# Patient Record
Sex: Female | Born: 1955 | Marital: Married | Smoking: Never smoker
Health system: Southern US, Community
[De-identification: ages and names within clinical notes are randomized; demographics above are authoritative.]

## PROBLEM LIST (undated history)

## (undated) DIAGNOSIS — M3500 Sicca syndrome, unspecified: Secondary | ICD-10-CM

## (undated) DIAGNOSIS — E069 Thyroiditis, unspecified: Secondary | ICD-10-CM

## (undated) DIAGNOSIS — M8580 Other specified disorders of bone density and structure, unspecified site: Secondary | ICD-10-CM

## (undated) DIAGNOSIS — I2510 Atherosclerotic heart disease of native coronary artery without angina pectoris: Secondary | ICD-10-CM

## (undated) DIAGNOSIS — K644 Residual hemorrhoidal skin tags: Secondary | ICD-10-CM

## (undated) DIAGNOSIS — I491 Atrial premature depolarization: Secondary | ICD-10-CM

## (undated) DIAGNOSIS — E041 Nontoxic single thyroid nodule: Secondary | ICD-10-CM

## (undated) DIAGNOSIS — I493 Ventricular premature depolarization: Secondary | ICD-10-CM

## (undated) DIAGNOSIS — K5790 Diverticulosis of intestine, part unspecified, without perforation or abscess without bleeding: Secondary | ICD-10-CM

## (undated) DIAGNOSIS — I471 Supraventricular tachycardia, unspecified: Secondary | ICD-10-CM

## (undated) DIAGNOSIS — L659 Nonscarring hair loss, unspecified: Secondary | ICD-10-CM

## (undated) DIAGNOSIS — D179 Benign lipomatous neoplasm, unspecified: Secondary | ICD-10-CM

## (undated) DIAGNOSIS — I10 Essential (primary) hypertension: Secondary | ICD-10-CM

## (undated) DIAGNOSIS — R7989 Other specified abnormal findings of blood chemistry: Secondary | ICD-10-CM

## (undated) DIAGNOSIS — I7300 Raynaud's syndrome without gangrene: Secondary | ICD-10-CM

## (undated) HISTORY — DX: Atrial premature depolarization: I49.1

## (undated) HISTORY — DX: Thyroiditis, unspecified: E06.9

## (undated) HISTORY — DX: Supraventricular tachycardia: I47.1

## (undated) HISTORY — DX: Atherosclerotic heart disease of native coronary artery without angina pectoris: I25.10

## (undated) HISTORY — DX: Ventricular premature depolarization: I49.3

## (undated) HISTORY — DX: Supraventricular tachycardia, unspecified: I47.10

## (undated) HISTORY — DX: Other specified disorders of bone density and structure, unspecified site: M85.80

## (undated) HISTORY — DX: Residual hemorrhoidal skin tags: K64.4

## (undated) HISTORY — DX: Raynaud's syndrome without gangrene: I73.00

## (undated) HISTORY — DX: Nontoxic single thyroid nodule: E04.1

## (undated) HISTORY — DX: Essential (primary) hypertension: I10

## (undated) HISTORY — DX: Diverticulosis of intestine, part unspecified, without perforation or abscess without bleeding: K57.90

## (undated) HISTORY — DX: Nonscarring hair loss, unspecified: L65.9

## (undated) HISTORY — DX: Sjogren syndrome, unspecified: M35.00

## (undated) HISTORY — DX: Other specified abnormal findings of blood chemistry: R79.89

## (undated) HISTORY — DX: Benign lipomatous neoplasm, unspecified: D17.9

---

## 1997-11-20 ENCOUNTER — Ambulatory Visit (HOSPITAL_COMMUNITY): Admission: RE | Admit: 1997-11-20 | Discharge: 1997-11-20 | Payer: Self-pay | Admitting: Specialist

## 1998-03-08 ENCOUNTER — Other Ambulatory Visit: Admission: RE | Admit: 1998-03-08 | Discharge: 1998-03-08 | Payer: Self-pay | Admitting: Obstetrics and Gynecology

## 1999-03-14 ENCOUNTER — Other Ambulatory Visit: Admission: RE | Admit: 1999-03-14 | Discharge: 1999-03-14 | Payer: Self-pay | Admitting: Obstetrics and Gynecology

## 1999-06-05 ENCOUNTER — Encounter: Payer: Self-pay | Admitting: Internal Medicine

## 1999-06-05 ENCOUNTER — Encounter: Admission: RE | Admit: 1999-06-05 | Discharge: 1999-06-05 | Payer: Self-pay | Admitting: Internal Medicine

## 1999-12-05 ENCOUNTER — Encounter: Admission: RE | Admit: 1999-12-05 | Discharge: 1999-12-05 | Payer: Self-pay | Admitting: Obstetrics and Gynecology

## 1999-12-05 ENCOUNTER — Encounter: Payer: Self-pay | Admitting: Obstetrics and Gynecology

## 2000-01-20 ENCOUNTER — Encounter (INDEPENDENT_AMBULATORY_CARE_PROVIDER_SITE_OTHER): Payer: Self-pay

## 2000-01-20 ENCOUNTER — Other Ambulatory Visit: Admission: RE | Admit: 2000-01-20 | Discharge: 2000-01-20 | Payer: Self-pay | Admitting: Obstetrics and Gynecology

## 2000-04-07 ENCOUNTER — Other Ambulatory Visit: Admission: RE | Admit: 2000-04-07 | Discharge: 2000-04-07 | Payer: Self-pay | Admitting: Obstetrics and Gynecology

## 2000-04-07 ENCOUNTER — Encounter: Payer: Self-pay | Admitting: Obstetrics and Gynecology

## 2000-04-07 ENCOUNTER — Encounter: Admission: RE | Admit: 2000-04-07 | Discharge: 2000-04-07 | Payer: Self-pay | Admitting: Obstetrics and Gynecology

## 2001-04-13 ENCOUNTER — Encounter: Payer: Self-pay | Admitting: Obstetrics and Gynecology

## 2001-04-13 ENCOUNTER — Encounter: Admission: RE | Admit: 2001-04-13 | Discharge: 2001-04-13 | Payer: Self-pay | Admitting: Obstetrics and Gynecology

## 2001-04-15 ENCOUNTER — Encounter: Admission: RE | Admit: 2001-04-15 | Discharge: 2001-04-15 | Payer: Self-pay | Admitting: Obstetrics and Gynecology

## 2001-04-15 ENCOUNTER — Encounter: Payer: Self-pay | Admitting: Obstetrics and Gynecology

## 2001-05-20 ENCOUNTER — Encounter: Payer: Self-pay | Admitting: Geriatric Medicine

## 2001-05-20 ENCOUNTER — Ambulatory Visit (HOSPITAL_COMMUNITY): Admission: RE | Admit: 2001-05-20 | Discharge: 2001-05-20 | Payer: Self-pay | Admitting: Geriatric Medicine

## 2001-08-17 ENCOUNTER — Other Ambulatory Visit: Admission: RE | Admit: 2001-08-17 | Discharge: 2001-08-17 | Payer: Self-pay | Admitting: Obstetrics and Gynecology

## 2002-03-22 ENCOUNTER — Encounter: Payer: Self-pay | Admitting: Geriatric Medicine

## 2002-03-22 ENCOUNTER — Encounter: Admission: RE | Admit: 2002-03-22 | Discharge: 2002-03-22 | Payer: Self-pay | Admitting: Geriatric Medicine

## 2002-10-12 ENCOUNTER — Encounter: Payer: Self-pay | Admitting: Obstetrics and Gynecology

## 2002-10-12 ENCOUNTER — Encounter: Admission: RE | Admit: 2002-10-12 | Discharge: 2002-10-12 | Payer: Self-pay | Admitting: Obstetrics and Gynecology

## 2003-01-23 ENCOUNTER — Other Ambulatory Visit: Admission: RE | Admit: 2003-01-23 | Discharge: 2003-01-23 | Payer: Self-pay | Admitting: Obstetrics and Gynecology

## 2003-12-20 ENCOUNTER — Encounter: Admission: RE | Admit: 2003-12-20 | Discharge: 2003-12-20 | Payer: Self-pay | Admitting: Geriatric Medicine

## 2004-01-31 ENCOUNTER — Other Ambulatory Visit: Admission: RE | Admit: 2004-01-31 | Discharge: 2004-01-31 | Payer: Self-pay | Admitting: Obstetrics and Gynecology

## 2004-02-11 ENCOUNTER — Encounter: Admission: RE | Admit: 2004-02-11 | Discharge: 2004-02-11 | Payer: Self-pay | Admitting: Obstetrics and Gynecology

## 2004-09-10 ENCOUNTER — Encounter: Admission: RE | Admit: 2004-09-10 | Discharge: 2004-10-13 | Payer: Self-pay | Admitting: Geriatric Medicine

## 2005-02-09 ENCOUNTER — Other Ambulatory Visit: Admission: RE | Admit: 2005-02-09 | Discharge: 2005-02-09 | Payer: Self-pay | Admitting: Obstetrics and Gynecology

## 2005-03-09 ENCOUNTER — Encounter: Admission: RE | Admit: 2005-03-09 | Discharge: 2005-03-09 | Payer: Self-pay | Admitting: Obstetrics and Gynecology

## 2006-02-09 ENCOUNTER — Other Ambulatory Visit: Admission: RE | Admit: 2006-02-09 | Discharge: 2006-02-09 | Payer: Self-pay | Admitting: Obstetrics and Gynecology

## 2006-03-10 ENCOUNTER — Encounter: Admission: RE | Admit: 2006-03-10 | Discharge: 2006-03-10 | Payer: Self-pay | Admitting: Obstetrics and Gynecology

## 2006-04-02 ENCOUNTER — Encounter: Admission: RE | Admit: 2006-04-02 | Discharge: 2006-04-02 | Payer: Self-pay | Admitting: Occupational Medicine

## 2006-04-12 ENCOUNTER — Encounter: Admission: RE | Admit: 2006-04-12 | Discharge: 2006-04-12 | Payer: Self-pay | Admitting: Rheumatology

## 2006-09-03 ENCOUNTER — Encounter: Admission: RE | Admit: 2006-09-03 | Discharge: 2006-09-03 | Payer: Self-pay | Admitting: Geriatric Medicine

## 2007-02-22 ENCOUNTER — Other Ambulatory Visit: Admission: RE | Admit: 2007-02-22 | Discharge: 2007-02-22 | Payer: Self-pay | Admitting: Obstetrics and Gynecology

## 2007-03-15 ENCOUNTER — Encounter: Admission: RE | Admit: 2007-03-15 | Discharge: 2007-03-15 | Payer: Self-pay | Admitting: Obstetrics and Gynecology

## 2007-09-20 ENCOUNTER — Encounter: Admission: RE | Admit: 2007-09-20 | Discharge: 2007-09-20 | Payer: Self-pay | Admitting: Geriatric Medicine

## 2008-03-16 ENCOUNTER — Encounter: Admission: RE | Admit: 2008-03-16 | Discharge: 2008-03-16 | Payer: Self-pay | Admitting: Gynecology

## 2008-04-04 ENCOUNTER — Ambulatory Visit (HOSPITAL_COMMUNITY): Admission: RE | Admit: 2008-04-04 | Discharge: 2008-04-04 | Payer: Self-pay | Admitting: Rheumatology

## 2008-08-25 IMAGING — CR DG FOOT COMPLETE 3+V*R*
3 series · 3 of 3 positions shown · non-contrast
Comparison: none

CLINICAL DATA: Crush injury.  Pain fourth and fifth metatarsals.
 RIGHT FOOT - 3 VIEW:

[view not recorded (1 of 3)]
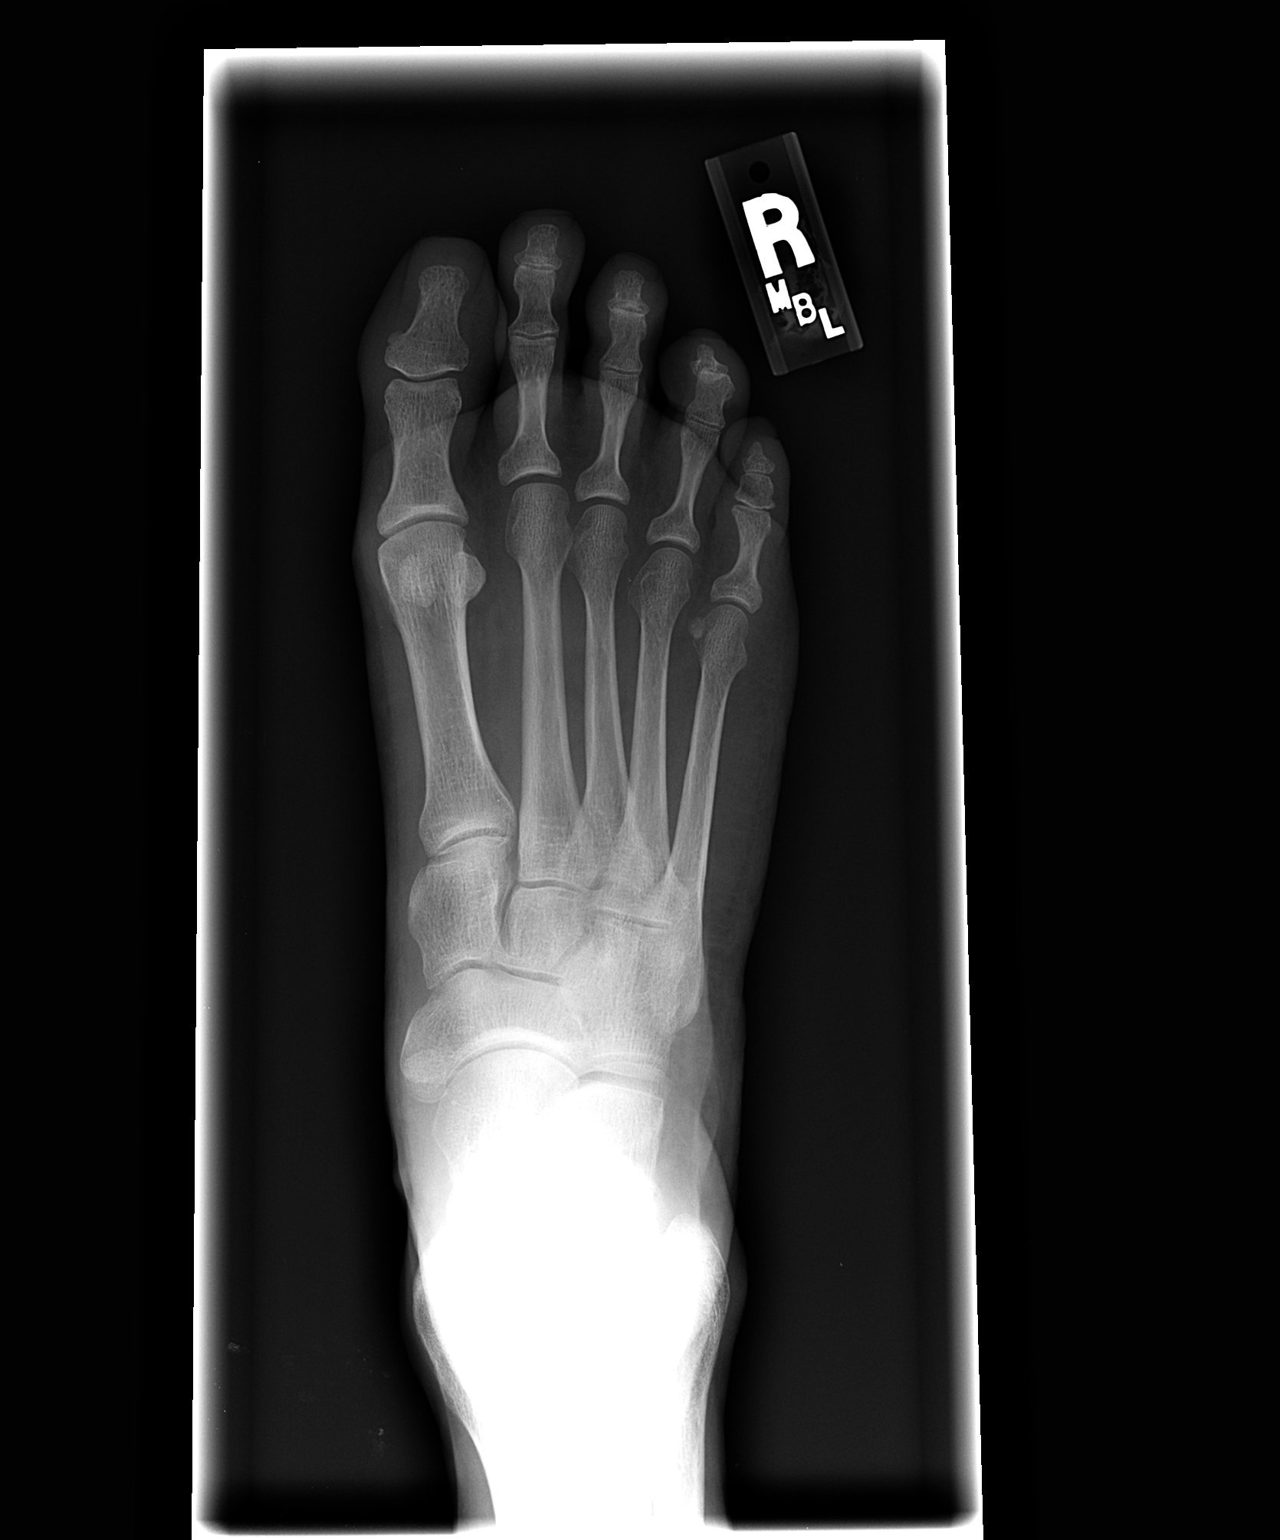

[view not recorded (2 of 3)]
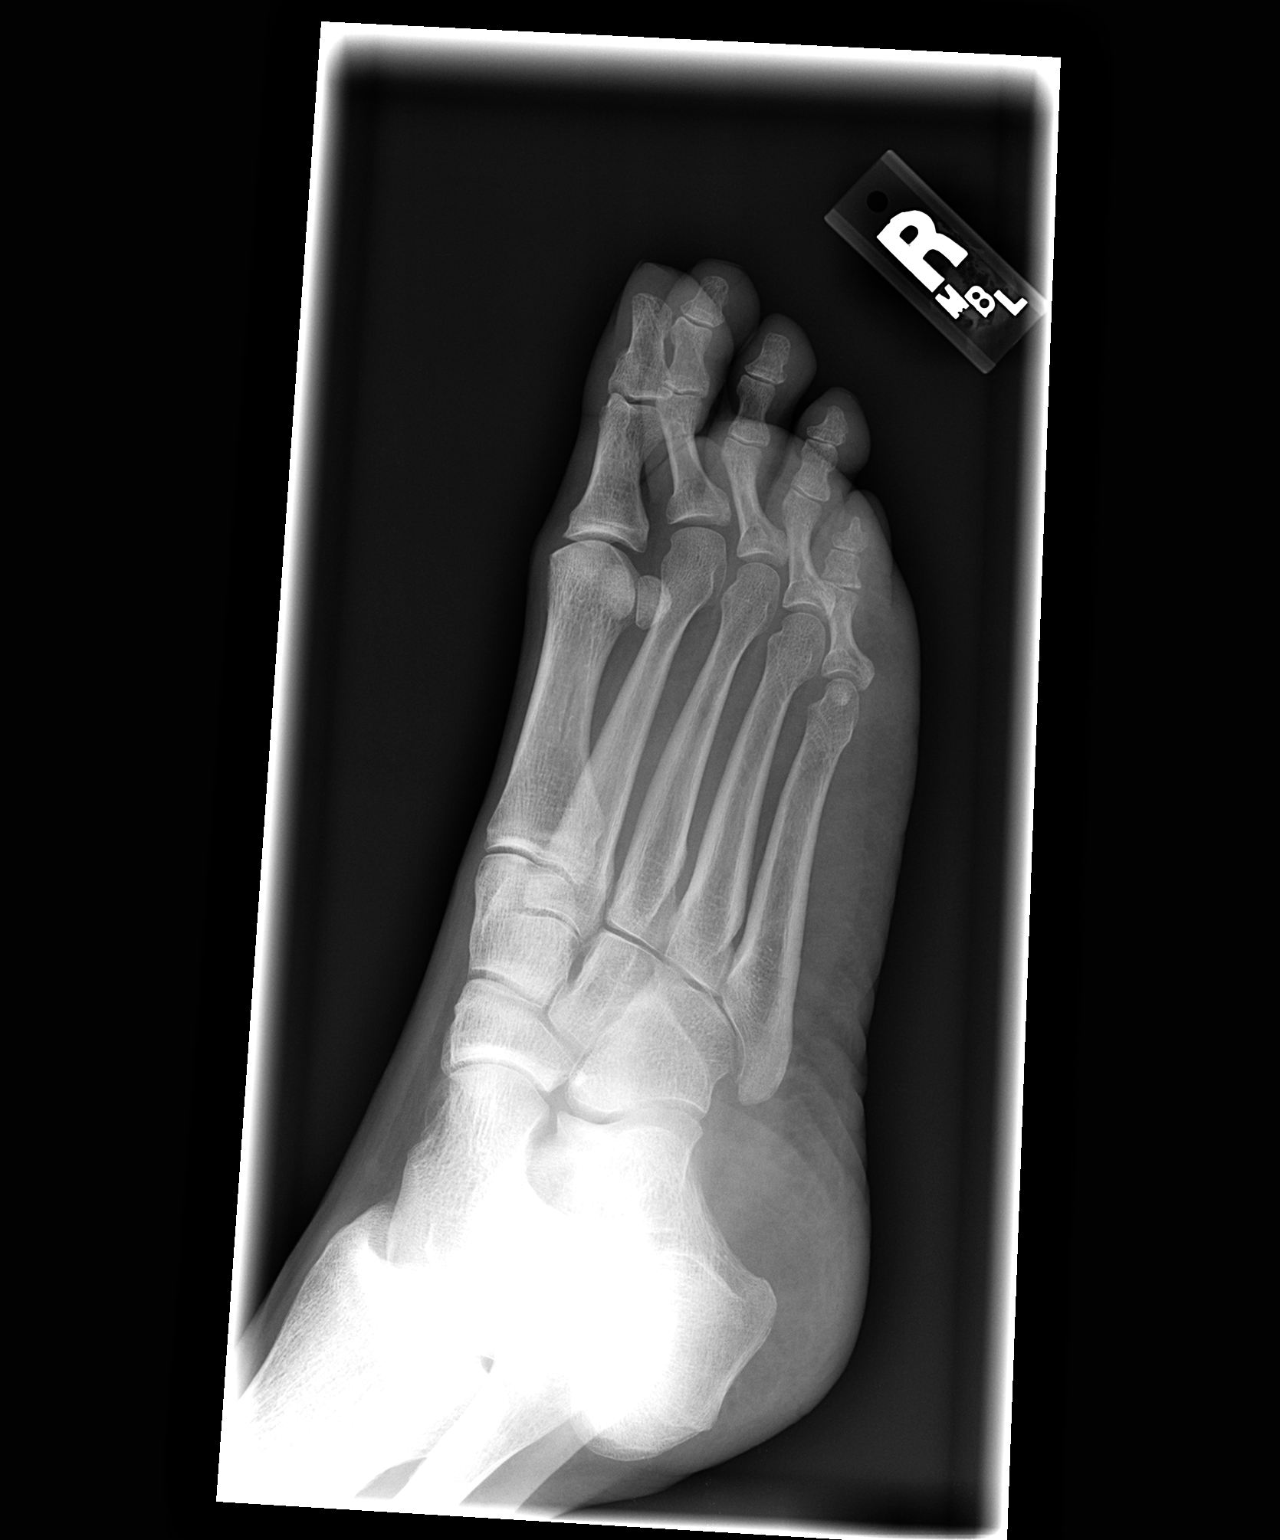

[view not recorded (3 of 3)]
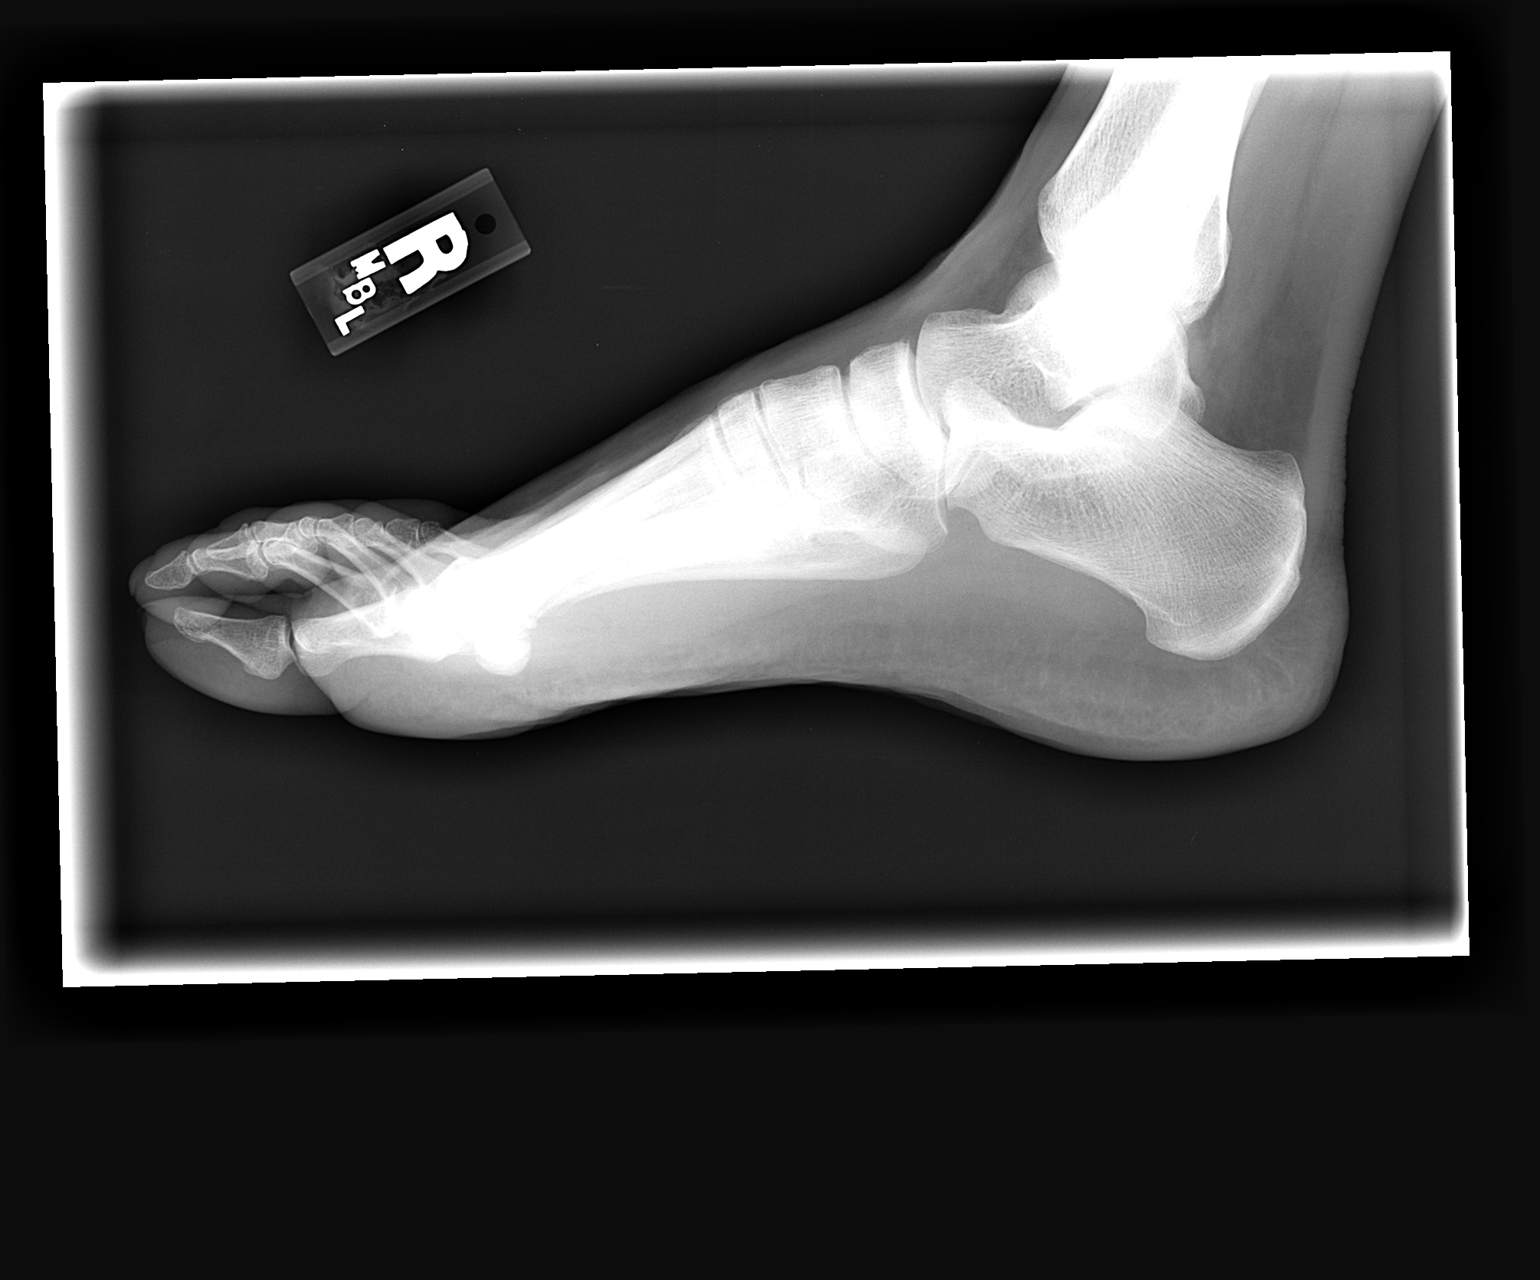

[3 of 3 positions shown; findings below may reference images not displayed]

FINDINGS: Imaged bones, joints and soft tissues appear normal.
IMPRESSION: Negative exam.

## 2009-04-18 ENCOUNTER — Encounter: Admission: RE | Admit: 2009-04-18 | Discharge: 2009-04-18 | Payer: Self-pay | Admitting: Gynecology

## 2009-06-18 ENCOUNTER — Encounter: Admission: RE | Admit: 2009-06-18 | Discharge: 2009-06-18 | Payer: Self-pay | Admitting: Rheumatology

## 2009-08-12 ENCOUNTER — Encounter: Admission: RE | Admit: 2009-08-12 | Discharge: 2009-08-12 | Payer: Self-pay | Admitting: Gynecology

## 2010-07-11 ENCOUNTER — Encounter
Admission: RE | Admit: 2010-07-11 | Discharge: 2010-07-11 | Payer: Self-pay | Source: Home / Self Care | Attending: Geriatric Medicine | Admitting: Geriatric Medicine

## 2010-08-10 ENCOUNTER — Encounter: Payer: Self-pay | Admitting: Orthopedic Surgery

## 2010-08-10 ENCOUNTER — Encounter: Payer: Self-pay | Admitting: Obstetrics and Gynecology

## 2010-08-11 ENCOUNTER — Encounter: Payer: Self-pay | Admitting: Gynecology

## 2010-12-04 ENCOUNTER — Other Ambulatory Visit: Payer: Self-pay | Admitting: Geriatric Medicine

## 2010-12-04 DIAGNOSIS — H93A9 Pulsatile tinnitus, unspecified ear: Secondary | ICD-10-CM

## 2010-12-10 ENCOUNTER — Ambulatory Visit
Admission: RE | Admit: 2010-12-10 | Discharge: 2010-12-10 | Disposition: A | Discharge status: Home / Self Care | Payer: BC Managed Care – PPO | Source: Ambulatory Visit | Attending: Geriatric Medicine | Admitting: Geriatric Medicine

## 2010-12-10 DIAGNOSIS — H93A9 Pulsatile tinnitus, unspecified ear: Secondary | ICD-10-CM

## 2010-12-10 MED ORDER — GADOBENATE DIMEGLUMINE 529 MG/ML IV SOLN
13.0000 mL | Freq: Once | INTRAVENOUS | Status: AC | PRN
  Administered 2010-12-10: 13 mL via INTRAVENOUS

## 2011-06-22 ENCOUNTER — Other Ambulatory Visit: Payer: Self-pay | Admitting: Internal Medicine

## 2011-06-22 DIAGNOSIS — E042 Nontoxic multinodular goiter: Secondary | ICD-10-CM

## 2011-06-22 DIAGNOSIS — E0590 Thyrotoxicosis, unspecified without thyrotoxic crisis or storm: Secondary | ICD-10-CM

## 2011-07-02 ENCOUNTER — Encounter (HOSPITAL_COMMUNITY)
Admission: RE | Admit: 2011-07-02 | Discharge: 2011-07-02 | Disposition: A | Discharge status: Home / Self Care | Payer: BC Managed Care – PPO | Source: Ambulatory Visit | Attending: Internal Medicine | Admitting: Internal Medicine

## 2011-07-02 DIAGNOSIS — E0590 Thyrotoxicosis, unspecified without thyrotoxic crisis or storm: Secondary | ICD-10-CM

## 2011-07-02 DIAGNOSIS — E042 Nontoxic multinodular goiter: Secondary | ICD-10-CM

## 2011-07-03 ENCOUNTER — Ambulatory Visit (HOSPITAL_COMMUNITY)
Admission: RE | Admit: 2011-07-03 | Discharge: 2011-07-03 | Disposition: A | Discharge status: Home / Self Care | Payer: BC Managed Care – PPO | Source: Ambulatory Visit | Attending: Internal Medicine | Admitting: Internal Medicine

## 2011-07-03 DIAGNOSIS — E042 Nontoxic multinodular goiter: Secondary | ICD-10-CM | POA: Insufficient documentation

## 2011-07-03 DIAGNOSIS — E0590 Thyrotoxicosis, unspecified without thyrotoxic crisis or storm: Secondary | ICD-10-CM | POA: Insufficient documentation

## 2011-07-03 MED ORDER — SODIUM PERTECHNETATE TC 99M INJECTION
10.0000 | Freq: Once | INTRAVENOUS | Status: AC | PRN
  Administered 2011-07-03: 10 via INTRAVENOUS

## 2014-11-14 ENCOUNTER — Ambulatory Visit (INDEPENDENT_AMBULATORY_CARE_PROVIDER_SITE_OTHER): Payer: No Typology Code available for payment source | Admitting: Sports Medicine

## 2014-11-14 ENCOUNTER — Encounter: Payer: Self-pay | Admitting: Sports Medicine

## 2014-11-14 VITALS — BP 111/56 | Ht 64.0 in | Wt 146.0 lb

## 2014-11-14 DIAGNOSIS — M7712 Lateral epicondylitis, left elbow: Secondary | ICD-10-CM | POA: Diagnosis not present

## 2014-11-14 DIAGNOSIS — M79675 Pain in left toe(s): Secondary | ICD-10-CM

## 2014-11-14 MED ORDER — NITROGLYCERIN 0.2 MG/HR TD PT24: MEDICATED_PATCH | TRANSDERMAL | Status: DC

## 2014-11-14 NOTE — Assessment & Plan Note (Signed)
Morton's foot. Medial column insufficiency. -Toe spacers given today -Would like to assess her gait and feet more with possibly trying green sport insoles at a future visit before her trip to [place] in September.

## 2014-11-14 NOTE — Progress Notes (Signed)
   Subjective:    Patient ID: Annette Flores, female    DOB: 1955-10-09, 59 y.o.   MRN: [id number]  HPI Annette Flores is a 59 year old right-hand-dominant female who presents with left elbow pain and left great toe pain. 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 Initially she had right lateral epicondylar pain, but this resolved after taking vitamin B 6 twice daily. Last November she started noticing that the left lateral epicondyles started hurting as well. Her pain has increased and occasionally wakes her up at night. Symptoms are aggravated with lifting objects, opening jars, or extending her wrist. Location of pain is primarily over the lateral epicondyles and an area just distal to. She denies any significant numbness, tingling, or weakness. No localized swelling or induration. She has tried taking vitamin B 6 once daily for the past week. She has also tried ice and massage. She saw a chiropractor who prescribed an exercise program, but she says that this initially aggravated her symptoms, feeling like something was "tearing". She denies any elbow locking, catching, swelling, or instability.  She also notes some left great toe pain. Location of pain is between the first and second metatarsals. No known injury. No localized swelling, warmth, or induration. She does not wear high heels frequently. She denies any numbness or tingling. She has not tried any relieving factors.  Past medical history, social history, medications, and allergies were reviewed and are up to date in the chart.  Review of Systems 7 point review of systems was performed and was otherwise negative unless noted in the history of present illness.     Objective:   Physical Exam BP 111/56 mmHg  Ht 5\' 4"  (1.626 m)  Wt 146 lb (66.225 kg)  BMI 25.05 kg/m2 GEN: The patient is well-developed well-nourished female and in no acute distress.  She is  awake alert and oriented x3. SKIN: warm and well-perfused, no rash  Neuro: Strength 5/5 globally. Sensation intact throughout. DTRs 2/4 bilaterally. No focal deficits. Vasc: +2 bilateral distal pulses. No edema.  MSK: Examination of the left elbow reveals grossly full range of motion with flexion and extension as well as pronation and supination. No localized swelling, warmth, or induration. No laxity with valgus varus testing. Tenderness is elicited with palpation over the left lateral epicondyles. Pain is reproduced with resisted supination and resisted wrist extension. No atrophy. Negative Tinel's at the medial epicondyle. The triceps tendon is palpably intact. She is neurovascularly intact distally. Examination of the bilateral feet reveals breakdown of the medial column and a Morton's foot. The second toe migrates toward the first toe to assist with pushoff. No overlying erythema or induration.  Limited musculoskeletal ultrasound: Long and short axis views were obtained of the left elbow and left great toe. There is an area of hypoechoic change throughout the left lateral epicondyles with a calcification located near the insertion. There is significant neo vessel formation in an area 2-3 cm distal to the proximal insertion. She also has some hyperechoic regions suggestive of scar tissue. No acute bony changes are seen in the cortex. No effusion is present. Examination of the left first MTP reveals a calcific spur on the dorsum with surrounding hypoechoic fluid. No significant joint effusion.    Assessment & Plan:  Please see problem based assessment and plan in the problem list.

## 2014-11-14 NOTE — Assessment & Plan Note (Signed)
[place] with hypoechoic split, neovessels, calcification, and scar tissue. -Start NTG protcol -Body helix compression sleeve fitted and applied. -HEP -f/u 6 wks or sooner prn

## 2014-11-14 NOTE — Patient Instructions (Signed)
-  Recommend ergonomic assessment at workplace -Start nitroglycerin patch (instructions below), plan for 6-12 weeks of the patch -Wear body helix compression sleeve for comfort -Continue Vit B6 1-2 times daily -Gel spacer to try between 1st and 2nd toes -Start home exercises for elbow within comfort level -Plan follow-up before your trip to assess if we can modify some sport insoles for your shoes to unload the pressure on your great toe. -Otherwise, follow-up in 6 weeks or sooner if needed

## 2014-12-26 ENCOUNTER — Ambulatory Visit: Payer: No Typology Code available for payment source | Admitting: Sports Medicine

## 2015-01-23 ENCOUNTER — Encounter: Payer: Self-pay | Admitting: Sports Medicine

## 2015-01-23 ENCOUNTER — Ambulatory Visit (INDEPENDENT_AMBULATORY_CARE_PROVIDER_SITE_OTHER): Payer: No Typology Code available for payment source | Admitting: Sports Medicine

## 2015-01-23 VITALS — BP 132/57 | HR 62 | Ht 64.0 in | Wt 146.0 lb

## 2015-01-23 DIAGNOSIS — M7712 Lateral epicondylitis, left elbow: Secondary | ICD-10-CM | POA: Diagnosis not present

## 2015-01-23 MED ORDER — NITROGLYCERIN 0.2 MG/HR TD PT24: MEDICATED_PATCH | TRANSDERMAL | Status: DC

## 2015-01-23 NOTE — Progress Notes (Signed)
Patient ID: Annette Flores, female   DOB: [birth year], 59 y.o.   MRN: [id number]  HPI: Annette Flores is a 59 y.o. female presenting for follow-up on her left lateral epicondylitis and left toe pain. Patient states the her left elbow pain is much improved with about a 50% improvement. She no longer has the nerve pain or tearing feeling that she previously had. She states she is able to do her routine activities without pain. She did not use the sleeve that was prescribed to her due to irritation and discomfort. She states she had improvement with the stretching exercises and NTG patches. NTG reduced pain almost immediately. Patient was unable to do strengthen exercises because it "hurt too bad".  But has continued other exercises with trainer.  Continues to endorse intermittent bone pain from bone spur.  Physical Exam: BP 132/57 mmHg  Pulse 62  Ht 5\' 4"  (1.626 m)  Wt 146 lb (66.225 kg)  BMI 25.05 kg/m2  Gen: well-developed, well-nourished, NAD, alert MSK: Full ROM with left elbow, No laxity with valgus/varus testing, No localized swelling or erythema. Non-tender to palpation of lateral epicondyle. Pain reproducible with book raise. Not noted with wrist or finger extension/ not noted with supination resistance Neuro: Sensation and strength intact Skin: warm, dry, and no notable rashes  Limited MSK [place]: Improvement in tendon appearance with reformation of tendon from initial tear that was seen. Bone spur along lateral epicondyle. No neo-vessel formation in tendon but some around bone spur. Much less hypoechoic change in common tendon  Assessment/Plan: Patient Active Problem List   Diagnosis Date Noted  . Left lateral epicondylitis 11/14/2014  . Pain of left great toe 11/14/2014   A: Improved epicondylitis. Stable left great toe bone spur.  - Encouraged strengthening exercises for patient such as dumbbell, wrist extension and rotation exercises/ isometric arm extension -  Continue NTG patches - Advised use of sleeve when doing arm exercises or heavy lifting - No treatment at this time for bone spur; conservative management - Follow-up as needed   Luiz Blare, DO 01/23/2015, 11:02 AM PGY-2, [place]

## 2015-01-23 NOTE — Patient Instructions (Addendum)
-  Recommend isometric exercises of arm to strengthen  -use small dumbbells and lift, work on wrist exercises such as lift and rotation with weights -Continue nitroglycerin patch, reordered for you -Wear body helix compression sleeve when using left arm for exercise -Conservative management for bone spur in foot; if continues to be a problem can consider other options -Follow-up as needed

## 2015-01-23 NOTE — Assessment & Plan Note (Signed)
Much improved However to get more complete tendon healing should cont NTG for another 2 mos  If not better in 2 mos I want to rechekc

## 2015-12-26 ENCOUNTER — Encounter: Payer: Self-pay | Admitting: Sports Medicine

## 2015-12-26 ENCOUNTER — Ambulatory Visit (INDEPENDENT_AMBULATORY_CARE_PROVIDER_SITE_OTHER): Payer: Managed Care, Other (non HMO) | Admitting: Sports Medicine

## 2015-12-26 VITALS — BP 132/81 | HR 67 | Ht 64.0 in | Wt 147.0 lb

## 2015-12-26 DIAGNOSIS — M216X9 Other acquired deformities of unspecified foot: Secondary | ICD-10-CM

## 2015-12-26 DIAGNOSIS — Q667 Congenital pes cavus, unspecified foot: Secondary | ICD-10-CM | POA: Insufficient documentation

## 2015-12-26 DIAGNOSIS — M2042 Other hammer toe(s) (acquired), left foot: Secondary | ICD-10-CM

## 2015-12-26 DIAGNOSIS — M2040 Other hammer toe(s) (acquired), unspecified foot: Secondary | ICD-10-CM | POA: Insufficient documentation

## 2015-12-26 NOTE — Assessment & Plan Note (Signed)
Cause of her symptoms being collapse the transverse arch with a flexion deformity at the PIP joint left second digit. She does have a Morton's foot with a high arch. -Green inserts with metatarsal pad to help support her metatarsal heads. Follow-up in 4-6 weeks which point consider custom orthotics. Also recommend anti-inflammatories as needed as well as wide toebox shoes.

## 2015-12-26 NOTE — Progress Notes (Signed)
  ALTHERIA VONG - 60 y.o. female MRN [id number]  Date of birth: [birth year] GEORGENA UNSELL is a 60 y.o. female who presents today for L foot pain.  Left foot pain, initial visit 56/8/17-patient presents today with ongoing dorsal foot pain forefoot. This been ongoing now for several months and she has previously seen Dr. Georgena Spurling at [place]. He recommended eventual surgery to 4 a tendon release for her hammertoe . She is not interested at this point in this procedure. Pain is worse at the end of the day or if she wears tight shoes.   PMHx - Updated and reviewed.  Contributory factors include:  Negative PSHx - Updated and reviewed.  Contributory factors include:  Negative FHx - Updated and reviewed.  Contributory factors include:  Negative Social Hx - Updated and reviewed. Contributory factors include:  Nonsmoker  Medications - reviewed   ROS Per HPI.  12 point negative other than per HPI.   Exam:  Filed Vitals:   12/26/15 1015  BP: 132/81  Pulse: 67   Gen: NAD, AAO 3 Cardio- RRR Pulm - Normal respiratory effort/rate Skin: No rashes or erythema Extremities: No edema  Vascular: pulses +2 bilateral upper and lower extremity Psych: Normal affect  Feet: Pes cavus with collapse the transverse arch left greater than right. She does have hammertoe left greater than right as well. Neurovascularly intact

## 2016-04-10 ENCOUNTER — Other Ambulatory Visit: Payer: Self-pay | Admitting: Obstetrics and Gynecology

## 2016-04-10 DIAGNOSIS — M7712 Lateral epicondylitis, left elbow: Secondary | ICD-10-CM

## 2016-08-17 ENCOUNTER — Other Ambulatory Visit: Payer: Self-pay | Admitting: Gastroenterology

## 2016-08-17 DIAGNOSIS — R1310 Dysphagia, unspecified: Secondary | ICD-10-CM

## 2016-08-17 DIAGNOSIS — R1319 Other dysphagia: Secondary | ICD-10-CM

## 2016-08-21 ENCOUNTER — Ambulatory Visit
Admission: RE | Admit: 2016-08-21 | Discharge: 2016-08-21 | Disposition: A | Discharge status: Home / Self Care | Payer: Managed Care, Other (non HMO) | Source: Ambulatory Visit | Attending: Gastroenterology | Admitting: Gastroenterology

## 2016-08-21 DIAGNOSIS — R1310 Dysphagia, unspecified: Secondary | ICD-10-CM

## 2016-08-21 DIAGNOSIS — R1319 Other dysphagia: Secondary | ICD-10-CM

## 2017-08-17 ENCOUNTER — Ambulatory Visit: Payer: PRIVATE HEALTH INSURANCE | Admitting: Sports Medicine

## 2017-08-17 ENCOUNTER — Ambulatory Visit: Payer: Self-pay

## 2017-08-17 ENCOUNTER — Encounter: Payer: Self-pay | Admitting: Sports Medicine

## 2017-08-17 VITALS — BP 120/60 | Ht 64.0 in | Wt 147.0 lb

## 2017-08-17 DIAGNOSIS — M25511 Pain in right shoulder: Secondary | ICD-10-CM

## 2017-08-17 DIAGNOSIS — S76312A Strain of muscle, fascia and tendon of the posterior muscle group at thigh level, left thigh, initial encounter: Secondary | ICD-10-CM | POA: Diagnosis not present

## 2017-08-17 MED ORDER — NITROGLYCERIN 0.2 MG/HR TD PT24: MEDICATED_PATCH | TRANSDERMAL | 1 refills | Status: DC

## 2017-08-17 NOTE — Patient Instructions (Signed)

## 2017-08-17 NOTE — Progress Notes (Signed)
Chief complaint: Right arm pain 3 months, left hamstring pain 4 months  History of present illness: Annette Flores is a 62 year old right-hand dominant female who presents to the sports medicine office today with chief complaint of right arm pain as well as left hamstring pain.  In regards to the right arm pain, she reports that symptoms have been present for approximately 3 months.  She reports that back 3 months ago she was doing some plank exercises, reports that she started feeling some pain after doing plank exercises.  She points to the front of her right shoulder near the short head of the biceps tendon as to where she feels the pain.  She reports pain with any type of abduction and overhead shoulder motion.  She reports feeling weak in the right arm secondary to pain.  She does not report of any numbness, tingling, or burning paresthesias radiating down the right arm.  She does not report of any previous right shoulder injury.  She does not report of any popping, catching, or symptoms of instability. She does not report of any neck pain or right elbow pain.  She has not had to use any medications for pain.  Over the last month or so she feels that symptoms have slightly improved.  In regards to her left hamstring pain, she reports that symptoms have been present for approximately 4 months.  She does not report of any specific inciting incident, trauma, or injury to explain the pain.  She points to the left proximal hamstring near the ischial tuberosity as point of maximal tenderness.  She reports any type of left leg extension as aggravating factor.  She reports that she does pain when she runs. She has not been able to run since December secondary to pain.  She starts to notice the pain at about the one mile mark.  She reports walking does not cause pain.  She reports noticing pain at nighttime if she stretches out her legs. She describes the pain as a throbbing and aching pain.   She does not report of  any numbness, tingling, or burning paresthesias radiating down her left lower extremity. She does not report of any back pain or left hip pain.  Review of systems:  As stated above  Her past medical history, surgical history, family history, and social history obtained and reviewed. Past medical history notable for hypothyroidism, she has not had any operations/procedures, she does not report of any current tobacco use, family history unremarkable for type 2 diabetes  Physical exam: Vital signs are reviewed and are documented in the chart Gen.: Alert, oriented, appears stated age, in no apparent distress HEENT: Moist oral mucosa Respiratory: Normal respirations, able to speak in full sentences Cardiac: Regular rate, distal pulses 2+ Integumentary: No rashes on visible skin:  Neurologic: She does have normal, intact rotator cuff strength on the right side, would categorize this as 5/5, she does have slight weakness with hamstring strength on the left side, would categorize strength as 4+/5, otherwise strength in bilateral upper and lower extremities 5/5, sensation 2+ in bilateral upper and lower extremities Psych: Normal affect, mood is described as good Musculoskeletal: Inspection of right shoulder reveals no obvious deformity or muscle atrophy, no warmth, erythema, ecchymosis, or effusion, she is tender to palpation in the right anterior shoulder over the short head of the biceps tendon, no tenderness over the distal clavicle, AC joint, acromion, scapular spine, cervical spine, or paracervical spinal processes, no tenderness over the trapezius, rhomboids, deltoid,  or triceps, she does have full range of motion of her right shoulder, but she does experience pain when getting to about 120 of forward flexion and 90 of abduction on the right side, she does have normal internal and external range of motion, strength as noted above, she does have pain with Speed, Yergason equivocal, she does have some  pain with empty can, cross arm, and O'Brien; Hawkins and Spurling negative  Musculoskeletal ultrasound was performed on her right shoulder today: -She does have circumferential hypoechoic changes seen along the proximal biceps tendon, but does have intact biceps tendon fibers, no intrasubstance hypoechogenicity seen -Unremarkable subscapularis tendon -She does have some hypoechoic changes and cortical irregularity noted along the articular side of the supraspinatus indicative of partial tearing of the supraspinatus tendon, do also see slight hypoechoic changes along the bursal side of the supraspinatus -Mushroom sign noted at [facility] joint and mild AC joint osteoarthritis noted with slight joint space narrowing -Unremarkable infraspinatus tendon -Unremarkable teres minor tendon  Impression: 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 Right shoulder pain, with ultrasound evidence of supraspinatus tendonopathy and partial degenerative tearing  2. Right proximal biceps tendonopathy without evidence of tearing 3. Left hamstring insertional tendonopathy 4. History of left lateral epicondylitis, asymptomatic today 5. History of pes cavus bilaterally  Plan: Discussed with [name] today that in regards to her right shoulder pain she does have ultrasound evidence of partial degenerative supraspinatus tearing as well as tendinopathy of the right proximal biceps tendon.  Discussed having her being started on nitroglycerin patch for partial degenerative supraspinatus tearing.  This would certainly explain her pain with abduction.  Will have her started on gentle range of motion and stretching exercises  For her right shoulder, discussed not going above 90 of forward flexion and abduction with these exercises.  For her left hamstring pain, discussed having insertional hamstring tendinopathy without any evidence of tearing.  Discussed to have her started on Askling exercises to do at home. This was given to her today.  Will plan to have her returnin 4-6 weeks for follow-up with repeat ultrasound at that time. Otherwise, will have her return sooner as needed.    Mort Sawyers, M.D. [facility]

## 2017-09-28 ENCOUNTER — Ambulatory Visit: Payer: PRIVATE HEALTH INSURANCE | Admitting: Sports Medicine

## 2017-10-05 ENCOUNTER — Ambulatory Visit: Payer: Self-pay

## 2017-10-05 ENCOUNTER — Encounter: Payer: Self-pay | Admitting: Sports Medicine

## 2017-10-05 ENCOUNTER — Ambulatory Visit: Payer: PRIVATE HEALTH INSURANCE | Admitting: Sports Medicine

## 2017-10-05 VITALS — BP 108/72 | Ht 64.0 in | Wt 146.0 lb

## 2017-10-05 DIAGNOSIS — M25511 Pain in right shoulder: Secondary | ICD-10-CM

## 2017-10-05 NOTE — Progress Notes (Signed)
Chief complaint: Follow-up of right arm pain x 4 months, left hamstring pain x 5 months  History of present illness: Annette Flores is a 62 year old right-hand-dominant female who presents to sports medicine office today for follow-up of right arm pain and left hamstring pain.  She was here in the office about 6 weeks ago for evaluation of these issues. Ultrasound of her right shoulder was done which showed partial degenerative articular sided supraspinatus tear and biceps tendinopathy without any evidence of degenerative tearing.  Ultrasound on her left hamstrings that she had insertional tendinopathy with slight calcification noted.  Fortunately in the interval, she reports complete resolution of left hamstring pain.  She reports that she does not any pain with leg extension like she did have least time.  She reports that she really has not been running though so she has not put this to the test.  She reports that she has been walking without any issues.  No numbness, tingling, or burning paresthesias in either lower extremity.  In regards to her right shoulder, she reports that with exercises she has noted interval improvement.  She reports that she was unable to use the nitroglycerin patch as she started getting headaches.  She reports that up until 2 weeks ago she was intervally improving.  She reports that she did do some painting and she started to get re-aggravation of symptoms with right anterior shoulder pain.  She reports that she has been doing the exercises as well as going to the gym and working with a trainer.  She reports that manipulation in her right shoulder and doing some posturing skills has helped.  She reports that yesterday she was pulling some weeds and noted some pain again.  She feels overall though symptoms are improving.  She has not had to use any medications for pain otherwise.  No numbness, tingling, burning paresthesias radiating down her right arm.  She does not report of any neck  pain.  She does report still feeling weak in the right arm, has not been able to use to 5 pound dumbbells for her home exercises.  She still reports noticing pain the most when doing any type of abduction or overhead shoulder motion.  Review of systems:  As stated above  Interval past medical history, surgical history, family history, and social history obtained and unchanged. Past medical history notable for hypothyroidism, she has not had any operations/procedures, she does not report of any current tobacco use, family history unremarkable for type 2 diabetes  Physical exam: Vital signs are reviewed and are documented in the chart Gen.: Alert, oriented, appears stated age, in no apparent distress HEENT: Moist oral mucosa Respiratory: Normal respirations, able to speak in full sentences Cardiac: Regular rate, distal pulses 2+ Integumentary: No rashes on visible skin:  Neurologic: Has great rotator cuff strength on right side, would categorize rotator cuff strength as 5/5, otherwise/5 in bilateral upper extremities, she does have great hamstring strength on the left side, would categorize this strength as 5/5, sensation 2+ in bilateral upper and lower extremities Psych: Normal affect, mood is described as good Musculoskeletal: Inspection of right shoulder reveals no obvious deformity or muscle atrophy, no warmth, erythema, ecchymosis, or effusion, she is tender to palpation over the short head of the biceps, otherwise no tenderness to palpation anywhere in the shoulder including the distal clavicle, AC joint, acromion, scapular spine, cervical spine, paracervical spinal processes, no tenderness of the trapezius, rhomboids, deltoid, triceps, she continues to maintain full range of motion  of her shoulder today, fortunately today without any elicitation of pain, rotator cuff impingement testing is negative as empty can, cross arm, O'Brien, Neer's, Hawkins are all negative, Speed, Yergason, and Spurling  negative; Inspection of left hamstrings reveals no obvious deformity or muscle atrophy, no warmth, erythema, ecchymosis, or effusion, no tenderness anywhere in the left hamstrings today, including the insertional aspect of the left hamstring on ischial tuberosity  Complete musculoskeletal ultrasound was performed on her right shoulder today: -She does have very slight physiologic circumferential hypoechoic changes seen along the mid-proximal biceps tendon, interval improvement from last time, has intact biceps tendon fibers, no intrasubstance hypoechogenicity seen -Unremarkable appearing subscapularis tendon -Interval improvement with healing noted along the bursal side of the supraspinatus, has a very small (~3 mm) calcification along the bursal side of supraspinatus, interval decrease in hypoechogenicity along the articular side of the supraspinatus -Mushroom sign noted at [facility] joint and mild AC joint osteoarthritis noted with slight joint space narrowing -Unremarkable appearing infraspinatus tendon -Unremarkable appearing teres minor tendon  Impression: Interval healing of degenerative articular-sided supraspinatus tendinopathy, as well as more proximal biceps tendonopathy without evidence of degenerative tearing  Ultrasound performed and interpreted by Mort Sawyers, MD and Stefanie Libel MD  Assessment and plan: 1. Right shoulder pain, with previous ultrasound evidence of supraspinatus tendonopathy and partial degenerative tearing, with interval improvement today 2. Right proximal biceps tendonopathy without evidence of tearing 3. Left hamstring insertional tendinopathy, symptoms resolved today 4. History of left lateral epicondylitis, asymptomatic today 5. History of pes cavus bilaterally  Plan: Discussed with Graylyn that does have interval improvement in healing for her right shoulder pain and supraspinatus tendinopathy.  Discussed continued range of motion and physical therapy  exercises for stretching and strengthening.  Discussed she does not need to use any medications for pain unless she has a flareup.  Discussed continued gym exercises and manipulation with her trainer.  If she is not any better in 4-6 weeks, discussed to have her return to office.  Otherwise, we will have her return on as-needed basis.  Mort Sawyers, M.D. [facility]  I observed and examined the patient with the resident and agree with assessment and plan.  Note reviewed and modified by me. Stefanie Libel, MD

## 2019-06-27 ENCOUNTER — Other Ambulatory Visit: Payer: Self-pay

## 2019-06-27 ENCOUNTER — Telehealth (INDEPENDENT_AMBULATORY_CARE_PROVIDER_SITE_OTHER): Payer: Medicare HMO | Admitting: Sports Medicine

## 2019-06-27 DIAGNOSIS — M5416 Radiculopathy, lumbar region: Secondary | ICD-10-CM | POA: Diagnosis not present

## 2019-06-27 MED ORDER — PREDNISONE 20 MG PO TABS: 20.0000 mg | ORAL_TABLET | Freq: Two times a day (BID) | ORAL | 0 refills | Status: DC

## 2019-06-27 NOTE — Progress Notes (Signed)
Patient consents to a video visit.  She understands the limitations of exam on this format.  She is at home.  Physician in [facility] office.  Video and audio connection fair.  CC: pain left hip radiating into left leg  Patient notes that 6 weeks ago she did some physical work with shoveling. No immediate pain but by next day some pain in left lower back area and radiating into hip and thigh. Pain was progressive and radiation started going to hip to lateral leg to medial knee as a pattern. Gradually became worse and now more recently affecting her sleep at night. She likes to hike but not comfortable trying this. Has done some easy walks without issue.  Past hx of piriformis syndrome causing some sciatica No Hx of known disc disease  ROS Denies weakness in either leg No bowel or bladder issues No numbness Tried a NTG patch and felt that gave her some pain relief! Some relief with NSAIDs  Video Exam Pleasant F in NAD Able to do full lumbar flexion No pain on left or right lateral bend Back extension refers some pain to left SIJ area  Able to toe walk  Able to stand on toe 1 leg - this does refer some pain to SIJ  Able to heel walk  1 foot balance equal side to side - fair

## 2019-06-27 NOTE — Assessment & Plan Note (Signed)
HEP Short walks Flexion stretches 1 week course of prednisone 20 bid  She will report as to how this works over next 3 to 4 weeks If not resolving we want to do lumbar spine films Exam of SIJ if not resolving as well

## 2019-09-05 ENCOUNTER — Ambulatory Visit: Payer: Medicare HMO

## 2020-01-03 ENCOUNTER — Telehealth: Payer: Self-pay | Admitting: Interventional Cardiology

## 2020-01-03 NOTE — Telephone Encounter (Signed)
Advised patient of message below. She verbalized understanding.

## 2020-01-03 NOTE — Telephone Encounter (Signed)
Left message for patient to call back.  Dr. Tamala Julian and his nurse are out of the office this week but will be in contact when they return.

## 2020-01-03 NOTE — Telephone Encounter (Signed)
New Message  Patient called and mentionned that she would like to schedule an appt with Dr. Tamala Julian. She hasn't seen Dr. Tamala Julian in 10 yrs and I mentionned to her that after 3 yrs she would be considered a new patient. Stated that she would need a referral to continue with appt. Stated that her current doctors would send her to [facility] instead of her but she wants to go to Dr. Tamala Julian. Wanted me to leave a message for Dr. Tamala Julian and nurse to consider and to call back.

## 2020-01-08 NOTE — Telephone Encounter (Signed)
Spoke with pt and scheduled her to see Dr. Tamala Julian in August.  Added her to wait list.

## 2020-03-07 NOTE — Progress Notes (Signed)
Cardiology Office Note:    Date:  03/08/2020   ID:  Annette Flores, DOB 07/26/1955, MRN [id number]  PCP:  Moreira, Fernanda I, MD  Cardiologist:  No primary care provider on file.   Referring MD: Stoneking, Hal, MD   Chief Complaint  Patient presents with  . Loss of Consciousness  . Chest Pain    History of Present Illness:    Annette Flores is a 64 y.o. female with a hx of near syncope referred by Dr. Stoneking for consultation.  Mrs. Annette Flores is here because of 2 distinct episodes of near syncope and chest discomfort that occurred in June 4 days apart.  Each occurred while she was driving.  She describes the episodes as a feeling of rumbling in her chest followed by pressure and ultimately a feeling of dizziness and near syncope.  She never had syncope but felt that if it had continued she might.  The first episode lasted 90 seconds or longer and then spontaneously resolved.  The second episode lasted considerably longer, perhaps 5 minutes.  She had to get off the highway because she was feeling dizzy and had a rumbling sensation in her chest.  I asked her to clarify rambling and she said heart palpitations.  Site symptoms did not occur with physical activity.  She is active in her yard working in temperature at times without difficulty.  She enjoys hiking although after these 2 episodes has been somewhat reluctant to go for hikes.  The patient's mother and several uncles and aunts had strokes/high blood pressure/but no heart attacks or bypass surgery.  Several siblings do not have heart history.  She does have hypertension and hyperlipidemia which is being treated as noted below.  Past Medical History:  Diagnosis Date  . Alopecia   . Diverticulosis   . Elevated LFTs   . External hemorrhoid   . Lipoma    OF LEFT LOWER RIB  . Osteopenia   . Raynaud phenomenon   . Sicca syndrome (HCC)   . Thyroid nodule   . Thyroiditis     History reviewed. No pertinent  surgical history.  Current Medications: Current Meds  Medication Sig  . aspirin 81 MG EC tablet Take 81 mg by mouth daily.  . atorvastatin (LIPITOR) 10 MG tablet Take 10 mg by mouth daily.  . levothyroxine (SYNTHROID, LEVOTHROID) 25 MCG tablet Take 25 mcg by mouth daily before breakfast.   . lisinopril (PRINIVIL,ZESTRIL) 5 MG tablet Take 7.5 mg by mouth daily.   . omeprazole (PRILOSEC) 20 MG capsule Take 20 mg by mouth daily.     Allergies:   Phenylalanine and Sulfa antibiotics   Social History   Socioeconomic History  . Marital status: Married    Spouse name: Not on file  . Number of children: Not on file  . Years of education: Not on file  . Highest education level: Not on file  Occupational History  . Not on file  Tobacco Use  . Smoking status: Never Smoker  . Smokeless tobacco: Never Used  Substance and Sexual Activity  . Alcohol use: Not on file  . Drug use: Not on file  . Sexual activity: Not on file  Other Topics Concern  . Not on file  Social History Narrative  . Not on file   Social Determinants of Health   Financial Resource Strain:   . Difficulty of Paying Living Expenses: Not on file  Food Insecurity:   . Worried About Running Out of Food   in the Last Year: Not on file  . Ran Out of Food in the Last Year: Not on file  Transportation Needs:   . Lack of Transportation (Medical): Not on file  . Lack of Transportation (Non-Medical): Not on file  Physical Activity:   . Days of Exercise per Week: Not on file  . Minutes of Exercise per Session: Not on file  Stress:   . Feeling of Stress : Not on file  Social Connections:   . Frequency of Communication with Friends and Family: Not on file  . Frequency of Social Gatherings with Friends and Family: Not on file  . Attends Religious Services: Not on file  . Active Member of Clubs or Organizations: Not on file  . Attends Club or Organization Meetings: Not on file  . Marital Status: Not on file     Family  History: The patient's family history is not on file.  ROS:   Please see the history of present illness.    No history of atrial fibrillation or arrhythmias in the past.  No prior history of syncope.  All other systems reviewed and are negative.  EKGs/Labs/Other Studies Reviewed:    The following studies were reviewed today: No new or recent cardiac imaging.  EKG:  EKG EKG reveals normal sinus rhythm and is normal in appearance.  Recent Labs: No results found for requested labs within last 8760 hours.  Recent Lipid Panel No results found for: CHOL, TRIG, HDL, CHOLHDL, VLDL, LDLCALC, LDLDIRECT  Physical Exam:    VS:  BP 122/66   Pulse 67   Ht 5' 4" (1.626 m)   Wt 143 lb (64.9 kg)   SpO2 95%   BMI 24.55 kg/m     Wt Readings from Last 3 Encounters:  03/08/20 143 lb (64.9 kg)  06/27/19 143 lb (64.9 kg)  10/05/17 146 lb (66.2 kg)     GEN: Healthy and age compatible.. No acute distress HEENT: Normal NECK: No JVD. LYMPHATICS: No lymphadenopathy CARDIAC:  RRR without murmur, gallop, or edema. VASCULAR:  Normal Pulses. No bruits. RESPIRATORY:  Clear to auscultation without rales, wheezing or rhonchi  ABDOMEN: Soft, non-tender, non-distended, No pulsatile mass, MUSCULOSKELETAL: No deformity  SKIN: Warm and dry NEUROLOGIC:  Alert and oriented x 3 PSYCHIATRIC:  Normal affect   ASSESSMENT:    1. Near syncope   2. Precordial pain   3. Palpitations   4. Educated about COVID-19 virus infection    PLAN:    In order of problems listed above:  1. This occurred in the setting of racing/palpitations.  Rule out atrial fibrillation or PSVT.  4-week monitor will be done.  If no discovery, will recommend that she get a EKG recording device. 2. Chest discomfort doing tachycardia raises the question of underlying atherosclerosis.  She does have risk factors.  Coronary CTA will be done to exclude high burden of atherosclerosis. 3. Rule out A. fib/PSVT with 4-week  monitor. 4. Vaccinated.  Has been safe during the COVID-19 pandemic.   Medication Adjustments/Labs and Tests Ordered: Current medicines are reviewed at length with the patient today.  Concerns regarding medicines are outlined above.  Orders Placed This Encounter  Procedures  . CT CORONARY MORPH W/CTA COR W/SCORE W/CA W/CM &/OR WO/CM  . CT CORONARY FRACTIONAL FLOW RESERVE DATA PREP  . CT CORONARY FRACTIONAL FLOW RESERVE FLUID ANALYSIS  . Basic metabolic panel  . Cardiac event monitor  . EKG 12-Lead   Meds ordered this encounter  Medications  .   metoprolol tartrate (LOPRESSOR) 50 MG tablet    Sig: Take one tablet by mouth 2 hours prior to your CT    Dispense:  1 tablet    Refill:  0    Patient Instructions  Medication Instructions:  Your physician recommends that you continue on your current medications as directed. Please refer to the Current Medication list given to you today.  *If you need a refill on your cardiac medications before your next appointment, please call your pharmacy*   Lab Work: None today  If you have labs (blood work) drawn today and your tests are completely normal, you will receive your results only by: . MyChart Message (if you have MyChart) OR . A paper copy in the mail If you have any lab test that is abnormal or we need to change your treatment, we will call you to review the results.   Testing/Procedures: Your physician has recommended that you wear an event monitor. Event monitors are medical devices that record the heart's electrical activity. Doctors most often us these monitors to diagnose arrhythmias. Arrhythmias are problems with the speed or rhythm of the heartbeat. The monitor is a small, portable device. You can wear one while you do your normal daily activities. This is usually used to diagnose what is causing palpitations/syncope (passing out).  Your physician recommends that you have a Coronary CT performed.   Follow-Up: At [facility], you and your health needs are our priority.  As part of our continuing mission to provide you with exceptional heart care, we have created designated Provider Care Teams.  These Care Teams include your primary Cardiologist (physician) and Advanced Practice Providers (APPs -  Physician Assistants and Nurse Practitioners) who all work together to provide you with the care you need, when you need it.  We recommend signing up for the patient portal called "MyChart".  Sign up information is provided on this After Visit Summary.  MyChart is used to connect with patients for Virtual Visits (Telemedicine).  Patients are able to view lab/test results, encounter notes, upcoming appointments, etc.  Non-urgent messages can be sent to your provider as well.   To learn more about what you can do with MyChart, go to [email or web address].    Your next appointment:   As needed  The format for your next appointment:   In Person  Provider:   You may see Dr. Henry Smith or one of the following Advanced Practice Providers on your designated Care Team:    Lori Gerhardt, NP  Laura Ingold, NP  Jill McDaniel, NP    Other Instructions  Your cardiac CT will be scheduled at one of the below locations:   [facility] [address] [place], [place] [address] [phone]  OR  [facility] [address] [place], [place] [address] [phone]  If scheduled at [facility], please arrive at the [place] [facility] main entrance of [facility] 30 minutes prior to test start time. Proceed to the [place] [facility] Department (first floor) to check-in and test prep.  If scheduled at [facility], please arrive 15 mins early for check-in and test prep.  Please follow these instructions carefully (unless otherwise directed):  On the Night Before the Test: . Be sure to Drink plenty of water. . Do not  consume any caffeinated/decaffeinated beverages or chocolate 12 hours prior to your test. . Do not take any antihistamines 12 hours prior to your test.     On the Day of the Test: . Drink plenty of water. Do not drink any water within one hour of the test. . Do not eat any food 4 hours prior to the test. . You may take your regular medications prior to the test.  . Take metoprolol (Lopressor) two hours prior to test. . HOLD Furosemide/Hydrochlorothiazide morning of the test. . FEMALES- please wear underwire-free bra if available       After the Test: . Drink plenty of water. . After receiving IV contrast, you may experience a mild flushed feeling. This is normal. . On occasion, you may experience a mild rash up to 24 hours after the test. This is not dangerous. If this occurs, you can take Benadryl 25 mg and increase your fluid intake. . If you experience trouble breathing, this can be serious. If it is severe call 911 IMMEDIATELY. If it is mild, please call our office. . If you take any of these medications: Glipizide/Metformin, Avandament, Glucavance, please do not take 48 hours after completing test unless otherwise instructed.   Once we have confirmed authorization from your insurance company, we will call you to set up a date and time for your test. Based on how quickly your insurance processes prior authorizations requests, please allow up to 4 weeks to be contacted for scheduling your Cardiac CT appointment. Be advised that routine Cardiac CT appointments could be scheduled as many as 8 weeks after your provider has ordered it.  For non-scheduling related questions, please contact the cardiac imaging nurse navigator should you have any questions/concerns: Marchia Bond, Cardiac Imaging Nurse Navigator Burley Saver, Interim Cardiac Imaging Nurse [place] [facility] Direct Office Dial: [phone]   For scheduling needs, including cancellations and  rescheduling, please call Vivien Rota at [phone], option 3.        Signed, Sinclair Grooms, MD  03/08/2020 10:54 AM    [place]

## 2020-03-08 ENCOUNTER — Ambulatory Visit (INDEPENDENT_AMBULATORY_CARE_PROVIDER_SITE_OTHER): Payer: Medicare HMO | Admitting: Interventional Cardiology

## 2020-03-08 ENCOUNTER — Encounter: Payer: Self-pay | Admitting: Interventional Cardiology

## 2020-03-08 ENCOUNTER — Telehealth: Payer: Self-pay | Admitting: Radiology

## 2020-03-08 ENCOUNTER — Other Ambulatory Visit: Payer: Self-pay

## 2020-03-08 VITALS — BP 122/66 | HR 67 | Ht 64.0 in | Wt 143.0 lb

## 2020-03-08 DIAGNOSIS — Z7189 Other specified counseling: Secondary | ICD-10-CM

## 2020-03-08 DIAGNOSIS — R072 Precordial pain: Secondary | ICD-10-CM

## 2020-03-08 DIAGNOSIS — R002 Palpitations: Secondary | ICD-10-CM | POA: Diagnosis not present

## 2020-03-08 DIAGNOSIS — R55 Syncope and collapse: Secondary | ICD-10-CM

## 2020-03-08 MED ORDER — METOPROLOL TARTRATE 50 MG PO TABS: ORAL_TABLET | ORAL | 0 refills | Status: DC

## 2020-03-08 NOTE — Patient Instructions (Signed)
Medication Instructions:  Your physician recommends that you continue on your current medications as directed. Please refer to the Current Medication list given to you today.  *If you need a refill on your cardiac medications before your next appointment, please call your pharmacy*   Lab Work: None today  If you have labs (blood work) drawn today and your tests are completely normal, you will receive your results only by: Marland Kitchen MyChart Message (if you have MyChart) OR . A paper copy in the mail If you have any lab test that is abnormal or we need to change your treatment, we will call you to review the results.   Testing/Procedures: Your physician has recommended that you wear an event monitor. Event monitors are medical devices that record the heart's electrical activity. Doctors most often [place] these monitors to diagnose arrhythmias. Arrhythmias are problems with the speed or rhythm of the heartbeat. The monitor is a small, portable device. You can wear one while you do your normal daily activities. This is usually used to diagnose what is causing palpitations/syncope (passing out).  Your physician recommends that you have a Coronary CT performed.   Follow-Up: At [facility], you and your health needs are our priority.  As part of our continuing mission to provide you with exceptional heart care, we have created designated Provider Care Teams.  These Care Teams include your primary Cardiologist (physician) and Advanced Practice Providers (APPs -  Physician Assistants and Nurse Practitioners) who all work together to provide you with the care you need, when you need it.  We recommend signing up for the patient portal called "MyChart".  Sign up information is provided on this After Visit Summary.  MyChart is used to connect with patients for Virtual Visits (Telemedicine).  Patients are able to view lab/test results, encounter notes, upcoming appointments, etc.  Non-urgent messages can be sent to  your provider as well.   To learn more about what you can do with MyChart, go to [email or web address].    Your next appointment:   As needed  The format for your next appointment:   In Person  Provider:   You may see Dr. Daneen Schick or one of the following Advanced Practice Providers on your designated Care Team:    Truitt Merle, NP  Cecilie Kicks, NP  Kathyrn Drown, NP    Other Instructions  Your cardiac CT will be scheduled at one of the below locations:   [facility] [address] [place], [place] [address] [phone]  [place] [address] [place], [place] [address] [phone]  If scheduled at [facility], please arrive at the [facility] main entrance of [facility] 30 minutes prior to test start time. Proceed to the [facility] Radiology Department (first floor) to check-in and test prep.  If scheduled at [facility], please arrive 15 mins early for check-in and test prep.  Please follow these instructions carefully (unless otherwise directed):  On the Night Before the Test: . Be sure to Drink plenty of water. . Do not consume any caffeinated/decaffeinated beverages or chocolate 12 hours prior to your test. . Do not take any antihistamines 12 hours prior to your test.   On the Day of the Test: . Drink plenty of water. Do not drink any water within one hour of the test. . Do not eat any food 4 hours prior to the test. . You may take your  regular medications prior to the test.  . Take metoprolol (Lopressor) two hours prior to test. . HOLD Furosemide/Hydrochlorothiazide morning of the test. . FEMALES- please wear underwire-free bra if available       After the Test: . Drink plenty of water. . After receiving IV contrast, you may experience a mild flushed feeling. This is normal. . On occasion, you may experience a mild rash up to 24 hours  after the test. This is not dangerous. If this occurs, you can take Benadryl 25 mg and increase your fluid intake. . If you experience trouble breathing, this can be serious. If it is severe call 911 IMMEDIATELY. If it is mild, please call our office. . If you take any of these medications: Glipizide/Metformin, Avandament, Glucavance, please do not take 48 hours after completing test unless otherwise instructed.   Once we have confirmed authorization from your insurance company, we will call you to set up a date and time for your test. Based on how quickly your insurance processes prior authorizations requests, please allow up to 4 weeks to be contacted for scheduling your Cardiac CT appointment. Be advised that routine Cardiac CT appointments could be scheduled as many as 8 weeks after your provider has ordered it.  For non-scheduling related questions, please contact the cardiac imaging nurse navigator should you have any questions/concerns: Marchia Bond, Cardiac Imaging Nurse Navigator Burley Saver, Interim Cardiac Imaging Nurse [place] [facility] Direct Office Dial: [phone]   For scheduling needs, including cancellations and rescheduling, please call Vivien Rota at [phone], option 3.

## 2020-03-08 NOTE — Telephone Encounter (Signed)
Enrolled patient for a 30 day [employer] Event monitor to be mailed to patients home.  

## 2020-03-15 ENCOUNTER — Encounter (INDEPENDENT_AMBULATORY_CARE_PROVIDER_SITE_OTHER): Payer: Medicare HMO

## 2020-03-15 DIAGNOSIS — R55 Syncope and collapse: Secondary | ICD-10-CM

## 2020-03-15 DIAGNOSIS — R002 Palpitations: Secondary | ICD-10-CM | POA: Diagnosis not present

## 2020-03-18 ENCOUNTER — Telehealth: Payer: Self-pay | Admitting: *Deleted

## 2020-03-18 NOTE — Telephone Encounter (Signed)
-----   Message from Roosvelt Maser sent at 03/15/2020  1:35 PM EDT ----- Regarding: ct heart   Patient scheduled 9/8 @ 9:15am.  Pt will need labs  Thanks, Vivien Rota

## 2020-03-18 NOTE — Telephone Encounter (Signed)
Left detailed message letting pt know the need for labs prior to CT.  Advised to call back and schedule lab appt.

## 2020-03-18 NOTE — Telephone Encounter (Signed)
Pt called back and scheduled labs for tomorrow.

## 2020-03-19 ENCOUNTER — Other Ambulatory Visit: Payer: Self-pay

## 2020-03-19 ENCOUNTER — Other Ambulatory Visit: Payer: Medicare HMO | Admitting: *Deleted

## 2020-03-19 DIAGNOSIS — R55 Syncope and collapse: Secondary | ICD-10-CM

## 2020-03-19 DIAGNOSIS — R072 Precordial pain: Secondary | ICD-10-CM

## 2020-03-19 DIAGNOSIS — R002 Palpitations: Secondary | ICD-10-CM

## 2020-03-19 LAB — BASIC METABOLIC PANEL
BUN/Creatinine Ratio: 18 (ref 12–28)
BUN: 14 mg/dL (ref 8–27)
CO2: 23 mmol/L (ref 20–29)
Calcium: 9.5 mg/dL (ref 8.7–10.3)
Chloride: 100 mmol/L (ref 96–106)
Creatinine, Ser: 0.8 mg/dL (ref 0.57–1.00)
GFR calc Af Amer: 89 mL/min/{1.73_m2} (ref >59)
GFR calc non Af Amer: 78 mL/min/{1.73_m2} (ref >59)
Glucose: 88 mg/dL (ref 65–99)
Potassium: 4.5 mmol/L (ref 3.5–5.2)
Sodium: 137 mmol/L (ref 134–144)

## 2020-03-26 ENCOUNTER — Telehealth (HOSPITAL_COMMUNITY): Payer: Self-pay | Admitting: Emergency Medicine

## 2020-03-26 NOTE — Telephone Encounter (Signed)
Attempted to call patient regarding upcoming cardiac CT appointment. °Left message on voicemail with name and callback number °Burdette Forehand RN Navigator Cardiac Imaging °[facility] °[phone] Office °[phone] Cell ° °

## 2020-03-26 NOTE — Telephone Encounter (Signed)
Reaching out to patient to offer assistance regarding upcoming cardiac imaging study; pt verbalizes understanding of appt date/time, parking situation and where to check in, pre-test NPO status and medications ordered, and verified current allergies; name and call back number provided for further questions should they arise Libbi Towner RN Navigator Cardiac Imaging [facility] [phone] office [phone] cell 

## 2020-03-26 NOTE — Telephone Encounter (Signed)
Reaching out to patient to offer assistance regarding upcoming cardiac imaging study; pt verbalizes understanding of appt date/time, parking situation and where to check in, pre-test NPO status and medications ordered, and verified current allergies; name and call back number provided for further questions should they arise Iysis Germain RN Navigator Cardiac Imaging [place] [facility] [phone] office [phone] cell 

## 2020-03-27 ENCOUNTER — Other Ambulatory Visit: Payer: Self-pay

## 2020-03-27 ENCOUNTER — Ambulatory Visit (HOSPITAL_COMMUNITY)
Admission: RE | Admit: 2020-03-27 | Discharge: 2020-03-27 | Disposition: A | Discharge status: Home / Self Care | Payer: Medicare HMO | Source: Ambulatory Visit | Attending: Interventional Cardiology | Admitting: Interventional Cardiology

## 2020-03-27 DIAGNOSIS — R072 Precordial pain: Secondary | ICD-10-CM

## 2020-03-27 MED ORDER — IOHEXOL 350 MG/ML SOLN
80.0000 mL | Freq: Once | INTRAVENOUS | Status: AC | PRN
  Administered 2020-03-27: 80 mL via INTRAVENOUS

## 2020-03-27 MED ORDER — NITROGLYCERIN 0.4 MG SL SUBL
0.8000 mg | SUBLINGUAL_TABLET | Freq: Once | SUBLINGUAL | Status: AC
  Administered 2020-03-27: 0.8 mg via SUBLINGUAL

## 2020-03-27 MED ORDER — NITROGLYCERIN 0.4 MG SL SUBL
SUBLINGUAL_TABLET | SUBLINGUAL | Status: AC
  Filled 2020-03-27: qty 2

## 2020-07-22 DIAGNOSIS — Z1231 Encounter for screening mammogram for malignant neoplasm of breast: Secondary | ICD-10-CM | POA: Diagnosis not present

## 2020-09-07 DIAGNOSIS — Z03818 Encounter for observation for suspected exposure to other biological agents ruled out: Secondary | ICD-10-CM | POA: Diagnosis not present

## 2020-09-07 DIAGNOSIS — Z20822 Contact with and (suspected) exposure to covid-19: Secondary | ICD-10-CM | POA: Diagnosis not present

## 2020-10-15 DIAGNOSIS — E063 Autoimmune thyroiditis: Secondary | ICD-10-CM | POA: Diagnosis not present

## 2020-10-15 DIAGNOSIS — E038 Other specified hypothyroidism: Secondary | ICD-10-CM | POA: Diagnosis not present

## 2020-10-16 DIAGNOSIS — E038 Other specified hypothyroidism: Secondary | ICD-10-CM | POA: Diagnosis not present

## 2020-10-16 DIAGNOSIS — E041 Nontoxic single thyroid nodule: Secondary | ICD-10-CM | POA: Diagnosis not present

## 2020-10-16 DIAGNOSIS — E063 Autoimmune thyroiditis: Secondary | ICD-10-CM | POA: Diagnosis not present

## 2020-10-19 DIAGNOSIS — Z20822 Contact with and (suspected) exposure to covid-19: Secondary | ICD-10-CM | POA: Diagnosis not present

## 2020-12-12 ENCOUNTER — Ambulatory Visit: Payer: Medicare HMO | Admitting: Sports Medicine

## 2020-12-12 ENCOUNTER — Other Ambulatory Visit: Payer: Self-pay

## 2020-12-12 VITALS — BP 114/66 | Ht 64.0 in | Wt 145.0 lb

## 2020-12-12 DIAGNOSIS — M216X9 Other acquired deformities of unspecified foot: Secondary | ICD-10-CM | POA: Diagnosis not present

## 2020-12-12 DIAGNOSIS — S93402A Sprain of unspecified ligament of left ankle, initial encounter: Secondary | ICD-10-CM

## 2020-12-12 NOTE — Progress Notes (Signed)
Office Visit Note   Patient: Annette Flores           Date of Birth: 02-24-1956           MRN: [id number] Visit Date: 12/12/2020 Requested by: Lauraine Rinne, MD [facility] [address] [place],  [place] [address] PCP: Thornton Dales I, MD  Subjective: CC: Left ankle pain  HPI: 65 year old female presenting to clinic with approximately 6 days of significant left ankle pain and swelling after twisting her ankle on a hiking trail.  Patient says that she slept, inverting her ankle and experiencing immediate pain in the lateral aspect.  She was able to get up and continue 5 more miles long the hike before needing to stop.  She says that she is trying to ice her ankle with some improvement.  She is worried that she may have a small fracture given the significant ongoing pain.  She says her swelling has improved since the initial injury, but it is still very tender.  She is able to walk, but states that this is quite painful. She is also concerned about her shoes.  She recently bought shoes at a very expensive shoe store, and they attempted to make her custom orthotics.  She feels as though these adjustments are incredibly uncomfortable, and caused her feet to "roll in."  She thinks this may have contributed to her initial inversion injury.  She wants to know if her insoles can be evaluated today.  No numbness or tingling in the foot.  She is otherwise doing well with no additional concerns.              Objective: Vital Signs: BP 114/66   Ht 5\' 4"  (1.626 m)   Wt 145 lb (65.8 kg)   BMI 24.89 kg/m  No flowsheet data found.   No flowsheet data found.  Physical Exam:  General:  Alert and oriented, in no acute distress. Pulm:  Breathing unlabored. Psy:  Normal mood, congruent affect. Skin: Left lateral ankle with ecchymosis extending across the anterior joint line and around the lateral malleolus and lateral foot.  Overlying skin is intact.  No rashes.  Left ankle  exam General assessment: Antalgic gait with decreased stance phase on left. Inspection: Mild pes planus bilaterally, with first metatarsal insufficiency on the left, along with lateral foot breakdown and transverse arch collapse.  On the right foot, she appears to have splaying of her first and second digits, indicative of medial foot breakdown.  Early hammering and fourth and fifth digits on the right. Swelling throughout the lateral foot and ankle on the left.  Seated Exam: Full range of motion in ankle, however she endorses pain with dorsiflexion and inversion.  Full range of motion in all toes without pain. Palpation: Endorses tenderness to palpation along the lateral malleolus.  There is also tenderness within the area of the lateral ligaments.  No pain over the fifth metatarsal or within the midfoot.  No pain with calf or Achilles squeeze.  Normal Lisfranc joint without swelling or bruising.   Ligamentous Examination:  There is significant tenderness as well as ecchymosis over the ATFL.  Appears to be relative sparing of the ATFL.   No tenderness over the medial deltoid ligament complex Anterior drawer without anterior slide Talar Tilt appropriate, though very painful.   No Tenderness over the Anterior joint line No pain with external rotation ('High ankle') testing.   Strength: 5/5 in eversion, inversion, and plantar/dorsiflexion Normal distal sensation  Imaging: Limited extremity ultrasound-left ankle:  There is significant soft tissue swelling throughout the left ankle. No obvious cortical disruptions along the length of the lateral malleolus and more proximal in the fibula. There is significant effusion in the area of the ATFL.  Impression: No evidence of acute fracture.  Consistent with low-grade ankle sprain.  Assessment & Plan: 65 year old female presenting to clinic with acute left ankle pain after inversion injury while hiking.  Negative [place] ankle rules, and  ultrasound is very reassuring.  Suspect a low-grade ankle sprain as an underlying source of her symptoms. -Discussed ankle range of motion, strengthening, and proprioception exercises to help hasten her recovery. -Provided with ankle compression sleeve for further support.  Discussed wearing this for the next 3 months while hiking.  -Evaluated her over-the-counter insoles, which did cause significant in-rolling in her gait.  Previous orthotics were dismantled.  We repositioned a metatarsal pad bilaterally.  Additionally, medial heel wedge was added on the right due to her evidence of medial foot breakdown. -Patient stated adjusted orthotics were much more comfortable, and no longer felt unstable. -She had a neutral gait after orthotic fitting.     Procedures: No procedures performed     I observed and examined the patient with the [facility] resident and agree with assessment and plan.  Note reviewed and modified by me. Ila Mcgill, MD

## 2020-12-17 ENCOUNTER — Ambulatory Visit: Payer: Medicare HMO | Admitting: Sports Medicine

## 2021-01-08 DIAGNOSIS — E063 Autoimmune thyroiditis: Secondary | ICD-10-CM | POA: Diagnosis not present

## 2021-01-08 DIAGNOSIS — K219 Gastro-esophageal reflux disease without esophagitis: Secondary | ICD-10-CM | POA: Diagnosis not present

## 2021-01-08 DIAGNOSIS — J302 Other seasonal allergic rhinitis: Secondary | ICD-10-CM | POA: Diagnosis not present

## 2021-01-08 DIAGNOSIS — E038 Other specified hypothyroidism: Secondary | ICD-10-CM | POA: Diagnosis not present

## 2021-01-08 DIAGNOSIS — E782 Mixed hyperlipidemia: Secondary | ICD-10-CM | POA: Diagnosis not present

## 2021-01-08 DIAGNOSIS — M25572 Pain in left ankle and joints of left foot: Secondary | ICD-10-CM | POA: Diagnosis not present

## 2021-01-08 DIAGNOSIS — M8589 Other specified disorders of bone density and structure, multiple sites: Secondary | ICD-10-CM | POA: Diagnosis not present

## 2021-01-08 DIAGNOSIS — E663 Overweight: Secondary | ICD-10-CM | POA: Diagnosis not present

## 2021-01-08 DIAGNOSIS — H6503 Acute serous otitis media, bilateral: Secondary | ICD-10-CM | POA: Diagnosis not present

## 2021-01-08 DIAGNOSIS — E611 Iron deficiency: Secondary | ICD-10-CM | POA: Diagnosis not present

## 2021-01-08 DIAGNOSIS — E538 Deficiency of other specified B group vitamins: Secondary | ICD-10-CM | POA: Diagnosis not present

## 2021-01-08 DIAGNOSIS — S99912A Unspecified injury of left ankle, initial encounter: Secondary | ICD-10-CM | POA: Diagnosis not present

## 2021-01-08 DIAGNOSIS — I10 Essential (primary) hypertension: Secondary | ICD-10-CM | POA: Diagnosis not present

## 2021-01-08 DIAGNOSIS — E559 Vitamin D deficiency, unspecified: Secondary | ICD-10-CM | POA: Diagnosis not present

## 2021-01-10 DIAGNOSIS — I10 Essential (primary) hypertension: Secondary | ICD-10-CM | POA: Diagnosis not present

## 2021-01-14 DIAGNOSIS — S82832A Other fracture of upper and lower end of left fibula, initial encounter for closed fracture: Secondary | ICD-10-CM | POA: Diagnosis not present

## 2021-01-15 DIAGNOSIS — S82832A Other fracture of upper and lower end of left fibula, initial encounter for closed fracture: Secondary | ICD-10-CM | POA: Diagnosis not present

## 2021-01-15 DIAGNOSIS — Z789 Other specified health status: Secondary | ICD-10-CM | POA: Diagnosis not present

## 2021-01-15 DIAGNOSIS — M25572 Pain in left ankle and joints of left foot: Secondary | ICD-10-CM | POA: Diagnosis not present

## 2021-01-15 DIAGNOSIS — Z7409 Other reduced mobility: Secondary | ICD-10-CM | POA: Diagnosis not present

## 2021-01-29 DIAGNOSIS — S82832A Other fracture of upper and lower end of left fibula, initial encounter for closed fracture: Secondary | ICD-10-CM | POA: Diagnosis not present

## 2021-01-29 DIAGNOSIS — M25572 Pain in left ankle and joints of left foot: Secondary | ICD-10-CM | POA: Diagnosis not present

## 2021-01-29 DIAGNOSIS — Z789 Other specified health status: Secondary | ICD-10-CM | POA: Diagnosis not present

## 2021-01-29 DIAGNOSIS — Z7409 Other reduced mobility: Secondary | ICD-10-CM | POA: Diagnosis not present

## 2021-02-14 DIAGNOSIS — S82832D Other fracture of upper and lower end of left fibula, subsequent encounter for closed fracture with routine healing: Secondary | ICD-10-CM | POA: Diagnosis not present

## 2021-03-10 DIAGNOSIS — H04123 Dry eye syndrome of bilateral lacrimal glands: Secondary | ICD-10-CM | POA: Diagnosis not present

## 2021-03-10 DIAGNOSIS — H52203 Unspecified astigmatism, bilateral: Secondary | ICD-10-CM | POA: Diagnosis not present

## 2021-03-19 DIAGNOSIS — Z20822 Contact with and (suspected) exposure to covid-19: Secondary | ICD-10-CM | POA: Diagnosis not present

## 2021-03-30 DIAGNOSIS — Z0100 Encounter for examination of eyes and vision without abnormal findings: Secondary | ICD-10-CM | POA: Diagnosis not present

## 2021-04-22 DIAGNOSIS — M19071 Primary osteoarthritis, right ankle and foot: Secondary | ICD-10-CM | POA: Diagnosis not present

## 2021-04-22 DIAGNOSIS — G5781 Other specified mononeuropathies of right lower limb: Secondary | ICD-10-CM | POA: Diagnosis not present

## 2021-04-22 DIAGNOSIS — M19072 Primary osteoarthritis, left ankle and foot: Secondary | ICD-10-CM | POA: Diagnosis not present

## 2021-04-22 DIAGNOSIS — M722 Plantar fascial fibromatosis: Secondary | ICD-10-CM | POA: Diagnosis not present

## 2021-04-23 DIAGNOSIS — E063 Autoimmune thyroiditis: Secondary | ICD-10-CM | POA: Diagnosis not present

## 2021-04-23 DIAGNOSIS — E039 Hypothyroidism, unspecified: Secondary | ICD-10-CM | POA: Diagnosis not present

## 2021-04-23 DIAGNOSIS — E038 Other specified hypothyroidism: Secondary | ICD-10-CM | POA: Diagnosis not present

## 2021-04-24 DIAGNOSIS — E038 Other specified hypothyroidism: Secondary | ICD-10-CM | POA: Diagnosis not present

## 2021-04-24 DIAGNOSIS — E063 Autoimmune thyroiditis: Secondary | ICD-10-CM | POA: Diagnosis not present

## 2021-05-09 DIAGNOSIS — I10 Essential (primary) hypertension: Secondary | ICD-10-CM | POA: Diagnosis not present

## 2021-05-12 DIAGNOSIS — I10 Essential (primary) hypertension: Secondary | ICD-10-CM | POA: Diagnosis not present

## 2021-05-12 DIAGNOSIS — J302 Other seasonal allergic rhinitis: Secondary | ICD-10-CM | POA: Diagnosis not present

## 2021-05-19 ENCOUNTER — Telehealth: Payer: Self-pay | Admitting: Interventional Cardiology

## 2021-05-19 ENCOUNTER — Encounter: Payer: Self-pay | Admitting: Cardiovascular Disease

## 2021-05-19 DIAGNOSIS — I10 Essential (primary) hypertension: Secondary | ICD-10-CM | POA: Diagnosis not present

## 2021-05-19 NOTE — Telephone Encounter (Signed)
Patient c/o Palpitations:  High priority if patient c/o lightheadedness, shortness of breath, or chest pain  How long have you had palpitations/irregular HR/ Afib? Are you having the symptoms now? no  Are you currently experiencing lightheadedness, SOB or CP? no  Do you have a history of afib (atrial fibrillation) or irregular heart rhythm? no  Have you checked your BP or HR? (document readings if available): does not have any readings but states that she has 9 skipped beats every 60 seconds when she has these episodes. She states that she just had her BP changed but feels that may be the reason she is having these episodes. The episodes started about 3 evenings ago. They were only happening at night but then happened yesterday during the day as well.   Are you experiencing any other symptoms? fatigue  Pt c/o Shortness Of Breath: STAT if SOB developed within the last 24 hours or pt is noticeably SOB on the phone  1. Are you currently SOB (can you hear that pt is SOB on the phone)? No, only experiences SOB when she has the episodes of her heart skipping beats  2. How long have you been experiencing SOB? Since she started experiencing the episodes of her heart skipping beats.  3. Are you SOB when sitting or when up moving around? Sitting   4. Are you currently experiencing any other symptoms? fatigue

## 2021-05-19 NOTE — Telephone Encounter (Signed)
Spoke with pt and she states she seen PCP last Monday due to HTN.  She had called them the previous week about it and they increased her Lisinopril from 7.5mg  to 10mg  (did this Thursday-Monday) and then seen her in the office last Monday and added HCTZ 12.5mg  QD.  For the last 3 nights, pt has noticed increased palps that are somewhat similar to what she had previously when she seen Dr. Tamala Julian in 2021.  Gets SOB and dizzy with these episodes but symptoms resolve as soon as palps stop.  Denies CP.  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  Scheduled pt to come in Wednesday to see [facility]. PA-C for evaluation.  Pt appreciative for call.

## 2021-05-19 NOTE — Telephone Encounter (Signed)
Error

## 2021-05-20 ENCOUNTER — Encounter: Payer: Self-pay | Admitting: Physician Assistant

## 2021-05-20 NOTE — Progress Notes (Addendum)
Cardiology Office Note    Date:  05/21/2021   ID:  Davonna, Ertl 1956/05/01, MRN [id number]  PCP:  Lauraine Rinne, MD  Cardiologist:  Sinclair Grooms, MD  Electrophysiologist:  None   Chief Complaint: palpitations  History of Present Illness:   Annette Flores is a 65 y.o. 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 She saw Dr. Tamala Julian in 02/2020 for near syncope, palpitations and chest discomfort. Event monitor 03/2020 was normal. Coronary CTA 03/2020 showed minimal nonobstructive CAD (0-24%) in the prox LAD, calcium score of zero. 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  She is seen back for evaluation of palpitations. In early October she developed headaches and elevated BP. She self-titrated lisinopril and saw urgent care who added HCTZ as well. However, she felt very poorly with this with anxiety and constipation. She recalls on 10/27 she had an episode of heart fluttering and SOB at rest that lasted about 10 minutes with associated skipping of her heartbeat 9x on auscultation. She believes HR was elevated at that time as well. She would cough or take deep breaths to ease the symptoms. This happened again one other time since then. No anginal type chest pain. She had had some arm pain but reproducible with palpation. She stopped HCTZ and otherwise feels well today with normal blood pressure.   Labwork independently reviewed: 04/2021 CBC wnl, K 4.3, Cr 0.81, TSH wnl 12/2020 LFTs wnl, LDL 93, trig 54   Past Medical History:  Diagnosis Date   Alopecia    Diverticulosis    Elevated LFTs    Essential hypertension    External hemorrhoid    Lipoma    OF LEFT LOWER RIB   Mild CAD    Osteopenia    Raynaud phenomenon    Sicca syndrome (HCC)     Thyroid nodule    Thyroiditis     History reviewed. No pertinent surgical history.  Current Medications: Current Meds  Medication Sig   albuterol (VENTOLIN HFA) 108 (90 Base) MCG/ACT inhaler 2 puffs as needed.   aspirin 81 MG EC tablet Take 81 mg by mouth daily.   atorvastatin (LIPITOR) 10 MG tablet Take 10 mg by mouth daily.   fluticasone (FLONASE) 50 MCG/ACT nasal spray Place 1 spray into both nostrils daily.   levothyroxine (SYNTHROID, LEVOTHROID) 25 MCG tablet Take 25 mcg by mouth daily before breakfast.    lisinopril (PRINIVIL,ZESTRIL) 5 MG tablet Take 7.5 mg by mouth daily.    lisinopril (ZESTRIL) 10 MG tablet Take 10 mg by mouth daily.   meloxicam (MOBIC) 7.5 MG tablet Take 7.5 mg by mouth daily.   methylPREDNISolone (MEDROL DOSEPAK) 4 MG TBPK tablet Take 4 mg by mouth as needed.   omeprazole (PRILOSEC) 20 MG capsule Take 20 mg by mouth daily.   [DISCONTINUED] hydrochlorothiazide (MICROZIDE) 12.5 MG capsule Take 12.5 mg by mouth every morning.     Allergies:   Phenylalanine and Sulfa antibiotics   Social History   Socioeconomic History   Marital status: Married    Spouse name: Not on file   Number of children: Not on file   Years of education: Not on file   Highest education level: Not on file  Occupational History   Not on file  Tobacco Use  Smoking status: Never   Smokeless tobacco: Never  Substance and Sexual Activity   Alcohol use: Not on file   Drug use: Not on file   Sexual activity: Not on file  Other Topics Concern   Not on file  Social History Narrative   Not on file   Social Determinants of Health   Financial Resource Strain: Not on file  Food Insecurity: Not on file  Transportation Needs: Not on file  Physical Activity: Not on file  Stress: Not on file  Social Connections: Not on file     Family History:  The patient's family history is negative for CAD.  ROS:   Please see the history of present illness.  All other systems are reviewed  and otherwise negative.    EKGs/Labs/Other Studies Reviewed:    Studies reviewed are outlined and summarized above. Reports included below if pertinent.  Monitor 03/2020 Normal sinus rhythm Sinus bradycvardia into mid 40 bpm range during sleep   Normal study  Cor CT 03/2020 AM: Cardiac/Coronary  CTA   TECHNIQUE: The patient was scanned on a [employer].   FINDINGS: A 100 kV prospective scan was triggered in the descending thoracic aorta at 111 HU's. Axial non-contrast 3 mm slices were carried out through the heart. The data set was analyzed on a dedicated work station and scored using the [place]. Gantry rotation speed was 250 msecs and collimation was .6 mm. 6.25 mg of PO Carvedilol, 25 mg of PO Metoprolol and 0.8 mg of sl NTG was given. The 3D data set was reconstructed in 5% intervals of the 67-82 % of the R-R cycle. Diastolic phases were analyzed on a dedicated work station using MPR, MIP and VRT modes. The patient received 80 cc of contrast.   Aorta: Normal size. Minimal atherosclerotic plaque and calcifications. No dissection.   Aortic Valve:  Trileaflet.  No calcifications.   Coronary Arteries:  Normal coronary origin.  Right dominance.   RCA is a large dominant artery that gives rise to PDA and PLA. There is no plaque.   Left main is a large artery that gives rise to LAD and LCX arteries.   LAD is a large vessel that gives rise to a small diagonal artery and has minimal non-calcified plaque in the proximal segment with stenosis 0-25%.   LCX is a small non-dominant artery that has no plaque.   Other findings:   Normal pulmonary vein drainage into the left atrium.   Normal left atrial appendage without a thrombus.   Normal size of the pulmonary artery.   IMPRESSION: 1. Coronary calcium score of 0. This was 0 percentile for age and sex matched control.   2. Normal coronary origin with right dominance.   3. CAD-RADS 1. Minimal  non-obstructive CAD (0-24%) in the proximal LAD. Consider non-atherosclerotic causes of chest pain. Consider preventive therapy and risk factor modification.     Electronically Signed   By: Ena Dawley   On: 03/27/2020 18:38  IMPRESSION: No significant incidental noncardiac findings are noted.   Electronically Signed: By: Vinnie Langton M.D. On: 03/27/2020 09:43      EKG:  EKG is ordered today, personally reviewed, demonstrating NSR 61bpm nonspecific TW III similar to prior.  Recent Labs: No results found for requested labs within last 8760 hours.  Recent Lipid Panel No results found for: CHOL, TRIG, HDL, CHOLHDL, VLDL, LDLCALC, LDLDIRECT  PHYSICAL EXAM:    VS:  BP 118/68   Pulse 61  Ht 5\' 4"  (1.626 m)   Wt 148 lb 6.4 oz (67.3 kg)   SpO2 98%   BMI 25.47 kg/m   BMI: Body mass index is 25.47 kg/m.  GEN: Well nourished, well developed female in no acute distress HEENT: normocephalic, atraumatic Neck: no JVD, carotid bruits, or masses Cardiac: RRR; no murmurs, rubs, or gallops, no edema  Respiratory:  clear to auscultation bilaterally, normal work of breathing GI: soft, nontender, nondistended, + BS MS: no deformity or atrophy Skin: warm and dry, no rash Neuro:  Alert and Oriented x 3, Strength and sensation are intact, follows commands Psych: euthymic mood, full affect  Wt Readings from Last 3 Encounters:  05/21/21 148 lb 6.4 oz (67.3 kg)  12/12/20 145 lb (65.8 kg)  03/08/20 143 lb (64.9 kg)     ASSESSMENT & PLAN:   1. Palpitations - by description sounds like ectopy but cannot exclude development of atrial fib/flutter. We discussed options of 14 day vs 30 day monitor and she feels 14 day would be sufficient. Query whether this was triggered by her HCTZ and potential electrolyte disturbance. Will recheck lytes today with magnesium as well. Arrange 2D echocardiogram to ensure structurally normal. Recent TSH and CBC were normal.  2. Minimal CAD - no  anginal-type chest pain or DOE. She inquired about ASA as she previously discontinued this at the advice of her GI doctor and latest research. She inquires whether she should restart given the palpitations. I do not think the palpitations themselves warrant restart and her coronary plaque was so minimal that it does not necessarily dictate her needing to take this. Of note, LDL was 93 in 12/2020 - with coronary plaque would consider titration of statin to goal LDL <70. Will check fasting lipids when she returns for echo. Obtain CMET with labs today.  3. Essential HTN - BP well controlled on lisinopril 10mg  daily. OK to remain off HCTZ.  4. Hx of iron deficiency - recent CBC a few weeks ago showed normal Hgb.    Disposition: F/u with me in 3 months, sooner if testing warrants earlier follow-up.   Medication Adjustments/Labs and Tests Ordered: Current medicines are reviewed at length with the patient today.  Concerns regarding medicines are outlined above. Medication changes, Labs and Tests ordered today are summarized above and listed in the Patient Instructions accessible in Encounters.   Signed, Charlie Pitter, PA-C  05/21/2021 4:57 PM    [facility] [address], [place], [place]  [address] Phone: [phone]; Fax: [phone]

## 2021-05-21 ENCOUNTER — Ambulatory Visit (INDEPENDENT_AMBULATORY_CARE_PROVIDER_SITE_OTHER): Payer: Medicare HMO

## 2021-05-21 ENCOUNTER — Other Ambulatory Visit: Payer: Self-pay

## 2021-05-21 ENCOUNTER — Encounter: Payer: Self-pay | Admitting: Physician Assistant

## 2021-05-21 ENCOUNTER — Ambulatory Visit: Payer: Medicare HMO | Admitting: Physician Assistant

## 2021-05-21 VITALS — BP 118/68 | HR 61 | Ht 64.0 in | Wt 148.4 lb

## 2021-05-21 DIAGNOSIS — I2510 Atherosclerotic heart disease of native coronary artery without angina pectoris: Secondary | ICD-10-CM | POA: Diagnosis not present

## 2021-05-21 DIAGNOSIS — R002 Palpitations: Secondary | ICD-10-CM | POA: Diagnosis not present

## 2021-05-21 DIAGNOSIS — I10 Essential (primary) hypertension: Secondary | ICD-10-CM

## 2021-05-21 DIAGNOSIS — E611 Iron deficiency: Secondary | ICD-10-CM | POA: Diagnosis not present

## 2021-05-21 NOTE — Patient Instructions (Addendum)
Medication Instructions:  Your physician recommends that you continue on your current medications as directed. Please refer to the Current Medication list given to you today.  *If you need a refill on your cardiac medications before your next appointment, please call your pharmacy*   Lab Work: TODAY:  [facility] ECHO, COME FASTING SO WE CAN GET:  LIPIDS   If you have labs (blood work) drawn today and your tests are completely normal, you will receive your results only by: MyChart Message (if you have MyChart) OR A paper copy in the mail If you have any lab test that is abnormal or we need to change your treatment, we will call you to review the results.   Testing/Procedures: Your physician has requested that you have an echocardiogram. Echocardiography is a painless test that uses sound waves to create images of your heart. It provides your doctor with information about the size and shape of your heart and how well your heart's chambers and valves are working. This procedure takes approximately one hour. There are no restrictions for this procedure.   ZIO XT- Long Term Monitor Instructions   Your physician has requested you wear your ZIO patch monitor 14 days.   This is a single patch monitor.  [employer] supplies one patch monitor per enrollment.  Additional stickers are not available.   Please do not apply patch if you will be having a Nuclear Stress Test, Echocardiogram, Cardiac CT, MRI, or Chest Xray during the time frame you would be wearing the monitor. The patch cannot be worn during these tests.  You cannot remove and re-apply the ZIO XT patch monitor.   Your ZIO patch monitor will be sent USPS Priority mail from [facility] directly to your home address. The monitor may also be mailed to a PO BOX if home delivery is not available.   It may take 3-5 days to receive your monitor after you have been enrolled.   Once you have received you monitor,  please review enclosed instructions.  Your monitor has already been registered assigning a specific monitor serial # to you.   Applying the monitor   Shave hair from upper left chest.   Hold abrader disc by orange tab.  Rub abrader in 40 strokes over left upper chest as indicated in your monitor instructions.   Clean area with 4 enclosed alcohol pads .  Use all pads to assure are is cleaned thoroughly.  Let dry.   Apply patch as indicated in monitor instructions.  Patch will be place under collarbone on left side of chest with arrow pointing upward.   Rub patch adhesive wings for 2 minutes.Remove white label marked "1".  Remove white label marked "2".  Rub patch adhesive wings for 2 additional minutes.   While looking in a mirror, press and release button in center of patch.  A small green light will flash 3-4 times .  This will be your only indicator the monitor has been turned on.     Do not shower for the first 24 hours.  You may shower after the first 24 hours.   Press button if you feel a symptom. You will hear a small click.  Record Date, Time and Symptom in the Patient Log Book.   When you are ready to remove patch, follow instructions on last 2 pages of Patient Log Book.  Stick patch monitor onto last page of Patient Log Book.   Place  Patient Log Book in [facility] box.  Use locking tab on box and tape box closed securely.  The [employer] has [employer] on it.  Please place in mailbox as soon as possible.  Your physician should have your test results approximately 7 days after the monitor has been mailed back to [facility]   Call [place] at [phone] if you have questions regarding your ZIO XT patch monitor.  Call them immediately if you see an orange light blinking on your monitor.   If your monitor falls off in less than 4 days contact our Monitor department at [phone].  If your monitor becomes loose or falls off after 4 days call  [employer] at [phone] for suggestions on securing your monitor.     Follow-Up: At [facility], you and your health needs are our priority.  As part of our continuing mission to provide you with exceptional heart care, we have created designated Provider Care Teams.  These Care Teams include your primary Cardiologist (physician) and Advanced Practice Providers (APPs -  Physician Assistants and Nurse Practitioners) who all work together to provide you with the care you need, when you need it.  We recommend signing up for the patient portal called "MyChart".  Sign up information is provided on this After Visit Summary.  MyChart is used to connect with patients for Virtual Visits (Telemedicine).  Patients are able to view lab/test results, encounter notes, upcoming appointments, etc.  Non-urgent messages can be sent to your provider as well.   To learn more about what you can do with MyChart, go to [email or web address].    Your next appointment:   3 MONTHS   The format for your next appointment:   In Person  Provider:   Melina Copa, PA-C  Other Instructions Echocardiogram An echocardiogram is a test that uses sound waves (ultrasound) to produce images of the heart. Images from an echocardiogram can provide important information about: Heart size and shape. The size and thickness and movement of your heart's walls. Heart muscle function and strength. Heart valve function or if you have stenosis. Stenosis is when the heart valves are too narrow. If blood is flowing backward through the heart valves (regurgitation). A tumor or infectious growth around the heart valves. Areas of heart muscle that are not working well because of poor blood flow or injury from a heart attack. Aneurysm detection. An aneurysm is a weak or damaged part of an artery wall. The wall bulges out from the normal force of blood pumping through the body. Tell a health care provider about: Any allergies you  have. All medicines you are taking, including vitamins, herbs, eye drops, creams, and over-the-counter medicines. Any blood disorders you have. Any surgeries you have had. Any medical conditions you have. Whether you are pregnant or may be pregnant. What are the risks? Generally, this is a safe test. However, problems may occur, including an allergic reaction to dye (contrast) that may be used during the test. What happens before the test? No specific preparation is needed. You may eat and drink normally. What happens during the test?  You will take off your clothes from the waist up and put on a hospital gown. Electrodes or electrocardiogram (ECG)patches may be placed on your chest. The electrodes or patches are then connected to a device that monitors your heart rate and rhythm. You will lie down on a table for an ultrasound exam. A gel will be applied to your chest to  help sound waves pass through your skin. A handheld device, called a transducer, will be pressed against your chest and moved over your heart. The transducer produces sound waves that travel to your heart and bounce back (or "echo" back) to the transducer. These sound waves will be captured in real-time and changed into images of your heart that can be viewed on a video monitor. The images will be recorded on a computer and reviewed by your health care provider. You may be asked to change positions or hold your breath for a short time. This makes it easier to get different views or better views of your heart. In some cases, you may receive contrast through an IV in one of your veins. This can improve the quality of the pictures from your heart. The procedure may vary among health care providers and hospitals. What can I expect after the test? You may return to your normal, everyday life, including diet, activities, and medicines, unless your health care provider tells you not to do that. Follow these instructions at home: It is  up to you to get the results of your test. Ask your health care provider, or the department that is doing the test, when your results will be ready. Keep all follow-up visits. This is important. Summary An echocardiogram is a test that uses sound waves (ultrasound) to produce images of the heart. Images from an echocardiogram can provide important information about the size and shape of your heart, heart muscle function, heart valve function, and other possible heart problems. You do not need to do anything to prepare before this test. You may eat and drink normally. After the echocardiogram is completed, you may return to your normal, everyday life, unless your health care provider tells you not to do that. This information is not intended to replace advice given to you by your health care provider. Make sure you discuss any questions you have with your health care provider. Document Revised: 02/27/2020 Document Reviewed: 02/27/2020 Elsevier Patient Education  2022 [employer].

## 2021-05-21 NOTE — Progress Notes (Unsigned)
Patient enrolled for [employer] to mail a 14 day ZIO XT monitor to her address on file.  Dr. Tamala Julian to read.

## 2021-05-23 DIAGNOSIS — M1990 Unspecified osteoarthritis, unspecified site: Secondary | ICD-10-CM | POA: Diagnosis not present

## 2021-05-23 DIAGNOSIS — J302 Other seasonal allergic rhinitis: Secondary | ICD-10-CM | POA: Diagnosis not present

## 2021-05-23 DIAGNOSIS — M8580 Other specified disorders of bone density and structure, unspecified site: Secondary | ICD-10-CM | POA: Diagnosis not present

## 2021-05-23 DIAGNOSIS — I10 Essential (primary) hypertension: Secondary | ICD-10-CM | POA: Diagnosis not present

## 2021-05-23 DIAGNOSIS — E039 Hypothyroidism, unspecified: Secondary | ICD-10-CM | POA: Diagnosis not present

## 2021-05-23 DIAGNOSIS — Z87892 Personal history of anaphylaxis: Secondary | ICD-10-CM | POA: Diagnosis not present

## 2021-05-23 DIAGNOSIS — Z833 Family history of diabetes mellitus: Secondary | ICD-10-CM | POA: Diagnosis not present

## 2021-05-23 DIAGNOSIS — K219 Gastro-esophageal reflux disease without esophagitis: Secondary | ICD-10-CM | POA: Diagnosis not present

## 2021-05-23 DIAGNOSIS — E663 Overweight: Secondary | ICD-10-CM | POA: Diagnosis not present

## 2021-05-23 DIAGNOSIS — K5900 Constipation, unspecified: Secondary | ICD-10-CM | POA: Diagnosis not present

## 2021-05-23 DIAGNOSIS — E785 Hyperlipidemia, unspecified: Secondary | ICD-10-CM | POA: Diagnosis not present

## 2021-05-23 DIAGNOSIS — R002 Palpitations: Secondary | ICD-10-CM | POA: Diagnosis not present

## 2021-05-23 DIAGNOSIS — Z8249 Family history of ischemic heart disease and other diseases of the circulatory system: Secondary | ICD-10-CM | POA: Diagnosis not present

## 2021-05-23 LAB — MAGNESIUM: Magnesium: 2.3 mg/dL (ref 1.6–2.3)

## 2021-05-23 LAB — COMPREHENSIVE METABOLIC PANEL
ALT: 28 IU/L (ref 0–32)
AST: 32 IU/L (ref 0–40)
Albumin/Globulin Ratio: 1.7 (ref 1.2–2.2)
Albumin: 4.6 g/dL (ref 3.8–4.8)
Alkaline Phosphatase: 95 IU/L (ref 44–121)
BUN/Creatinine Ratio: 16 (ref 12–28)
BUN: 14 mg/dL (ref 8–27)
Bilirubin Total: 0.3 mg/dL (ref 0.0–1.2)
CO2: 24 mmol/L (ref 20–29)
Calcium: 9.9 mg/dL (ref 8.7–10.3)
Chloride: 102 mmol/L (ref 96–106)
Creatinine, Ser: 0.89 mg/dL (ref 0.57–1.00)
Globulin, Total: 2.7 g/dL (ref 1.5–4.5)
Glucose: 79 mg/dL (ref 70–99)
Potassium: 4.3 mmol/L (ref 3.5–5.2)
Sodium: 141 mmol/L (ref 134–144)
Total Protein: 7.3 g/dL (ref 6.0–8.5)
eGFR: 71 mL/min/{1.73_m2} (ref >59)

## 2021-06-05 ENCOUNTER — Other Ambulatory Visit: Payer: Medicare HMO

## 2021-06-05 ENCOUNTER — Other Ambulatory Visit: Payer: Self-pay

## 2021-06-05 ENCOUNTER — Ambulatory Visit (HOSPITAL_COMMUNITY): Payer: Medicare HMO | Attending: Cardiology

## 2021-06-05 DIAGNOSIS — I10 Essential (primary) hypertension: Secondary | ICD-10-CM | POA: Diagnosis not present

## 2021-06-05 DIAGNOSIS — I5030 Unspecified diastolic (congestive) heart failure: Secondary | ICD-10-CM

## 2021-06-05 DIAGNOSIS — I2510 Atherosclerotic heart disease of native coronary artery without angina pectoris: Secondary | ICD-10-CM | POA: Diagnosis not present

## 2021-06-05 DIAGNOSIS — R002 Palpitations: Secondary | ICD-10-CM | POA: Diagnosis not present

## 2021-06-05 LAB — ECHOCARDIOGRAM COMPLETE
Area-P 1/2: 3.6 cm2
S' Lateral: 2 cm

## 2021-06-06 ENCOUNTER — Telehealth: Payer: Self-pay | Admitting: *Deleted

## 2021-06-06 DIAGNOSIS — Z79899 Other long term (current) drug therapy: Secondary | ICD-10-CM

## 2021-06-06 LAB — LIPID PANEL
Chol/HDL Ratio: 2.5 ratio (ref 0.0–4.4)
Cholesterol, Total: 184 mg/dL (ref 100–199)
HDL: 73 mg/dL (ref >39)
LDL Chol Calc (NIH): 100 mg/dL — ABNORMAL HIGH (ref 0–99)
Triglycerides: 59 mg/dL (ref 0–149)
VLDL Cholesterol Cal: 11 mg/dL (ref 5–40)

## 2021-06-06 MED ORDER — ATORVASTATIN CALCIUM 20 MG PO TABS: 20.0000 mg | ORAL_TABLET | Freq: Every day | ORAL | 3 refills | Status: DC

## 2021-06-06 NOTE — Progress Notes (Signed)
Pt has been made aware of normal result and verbalized understanding.  jw

## 2021-06-06 NOTE — Telephone Encounter (Signed)
-----   Message from Charlie Pitter, [place] sent at 06/06/2021  9:17 AM EST ----- Please let pt know LDL appears still slightly above goal given presence of mild coronary plaque in the past. We can try increasing atorvastatin to 20mg  daily although may ultimately need 40. Let's go to 20 and recheck fasting liver/lipids in [redacted] weeks along with working on healthy diet/exercise. Thanks!

## 2021-06-20 DIAGNOSIS — R002 Palpitations: Secondary | ICD-10-CM | POA: Diagnosis not present

## 2021-07-09 DIAGNOSIS — Z0000 Encounter for general adult medical examination without abnormal findings: Secondary | ICD-10-CM | POA: Diagnosis not present

## 2021-07-09 DIAGNOSIS — E559 Vitamin D deficiency, unspecified: Secondary | ICD-10-CM | POA: Diagnosis not present

## 2021-07-09 DIAGNOSIS — E538 Deficiency of other specified B group vitamins: Secondary | ICD-10-CM | POA: Diagnosis not present

## 2021-07-09 DIAGNOSIS — Z23 Encounter for immunization: Secondary | ICD-10-CM | POA: Diagnosis not present

## 2021-07-09 DIAGNOSIS — E063 Autoimmune thyroiditis: Secondary | ICD-10-CM | POA: Diagnosis not present

## 2021-07-09 DIAGNOSIS — Z1211 Encounter for screening for malignant neoplasm of colon: Secondary | ICD-10-CM | POA: Diagnosis not present

## 2021-07-09 DIAGNOSIS — K219 Gastro-esophageal reflux disease without esophagitis: Secondary | ICD-10-CM | POA: Diagnosis not present

## 2021-07-09 DIAGNOSIS — E611 Iron deficiency: Secondary | ICD-10-CM | POA: Diagnosis not present

## 2021-07-09 DIAGNOSIS — I10 Essential (primary) hypertension: Secondary | ICD-10-CM | POA: Diagnosis not present

## 2021-07-09 DIAGNOSIS — M8589 Other specified disorders of bone density and structure, multiple sites: Secondary | ICD-10-CM | POA: Diagnosis not present

## 2021-07-15 DIAGNOSIS — Z1331 Encounter for screening for depression: Secondary | ICD-10-CM | POA: Diagnosis not present

## 2021-07-15 DIAGNOSIS — I10 Essential (primary) hypertension: Secondary | ICD-10-CM | POA: Diagnosis not present

## 2021-07-15 DIAGNOSIS — E785 Hyperlipidemia, unspecified: Secondary | ICD-10-CM | POA: Diagnosis not present

## 2021-07-15 DIAGNOSIS — E039 Hypothyroidism, unspecified: Secondary | ICD-10-CM | POA: Diagnosis not present

## 2021-07-15 DIAGNOSIS — Z1389 Encounter for screening for other disorder: Secondary | ICD-10-CM | POA: Diagnosis not present

## 2021-07-25 DIAGNOSIS — Z1231 Encounter for screening mammogram for malignant neoplasm of breast: Secondary | ICD-10-CM | POA: Diagnosis not present

## 2021-08-01 ENCOUNTER — Other Ambulatory Visit: Payer: Medicare HMO | Admitting: *Deleted

## 2021-08-01 ENCOUNTER — Other Ambulatory Visit: Payer: Self-pay

## 2021-08-01 DIAGNOSIS — Z79899 Other long term (current) drug therapy: Secondary | ICD-10-CM

## 2021-08-01 LAB — HEPATIC FUNCTION PANEL
ALT: 25 IU/L (ref 0–32)
AST: 29 IU/L (ref 0–40)
Albumin: 4.4 g/dL (ref 3.8–4.8)
Alkaline Phosphatase: 87 IU/L (ref 44–121)
Bilirubin Total: 0.4 mg/dL (ref 0.0–1.2)
Bilirubin, Direct: 0.13 mg/dL (ref 0.00–0.40)
Total Protein: 6.6 g/dL (ref 6.0–8.5)

## 2021-08-01 LAB — LIPID PANEL
Chol/HDL Ratio: 2.4 ratio (ref 0.0–4.4)
Cholesterol, Total: 154 mg/dL (ref 100–199)
HDL: 64 mg/dL (ref >39)
LDL Chol Calc (NIH): 77 mg/dL (ref 0–99)
Triglycerides: 64 mg/dL (ref 0–149)
VLDL Cholesterol Cal: 13 mg/dL (ref 5–40)

## 2021-08-14 ENCOUNTER — Encounter: Payer: Self-pay | Admitting: Physician Assistant

## 2021-08-14 NOTE — Progress Notes (Addendum)
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 Her husband Jamieka Royle is a retired Pharmacist, community. She originally saw Dr. Tamala Julian in 02/2020 for near syncope, palpitations and chest discomfort. Event monitor 03/2020 was normal. Coronary CTA 03/2020 showed minimal nonobstructive CAD (0-24%) in the prox LAD, calcium score of zero.  She was seen back in clinic 05/2021 with another episodic short-lived palpitations associated with some SOB, improved with coughing or taking a deep breath.  2D echo 05/2021 EF 60-65%, grade 1 Dd, mild LVH of basal septal segment, mild BAE, aortic sclerosis without stenosis. Event monitor 05/2021 showed NSR, <1% burden PACS,PVCs, 6 brief episodes of PSVT <11 seconds noted, overall benign, min HR 44, max 21bpm, average HR 70bpm. Dr. Tamala Julian recommended to follow conservatively. Statin was titrated for LDL 100, coming down to 77 on follow-up. She has a family history of stroke in extended relatives.  She is seen back for follow-up doing well. She feels reassured by the monitor results. Symptoms are fairly short lived and do not interfere with her general everyday life. She notes that when they do occur, the palpitations occur at rest rather than exertion. She has begun exercising 3-4x a week and does not report any new cardiac symptoms with this. She will be working on reducing her portion sizes. She already eats a Mediterranean style diet.  Labwork independently reviewed: 07/2021 LDL 77, trig  64, HDL 65, LFTS wnl 05/2022 LDL 100, MG 2.3, K 4.3, Cr 0.89 04/2021 CBC wnl, K 4.3, Cr 0.81, TSH wnl 12/2020 LFTs wnl, LDL 93, trig 54  Cardiology Studies:   Studies reviewed are outlined and summarized above. Reports included below if pertinent.   2D echo 05/2021   1. Left ventricular ejection fraction, by estimation, is 60 to 65%. The  left ventricle has normal function. The left ventricle has no regional  wall motion abnormalities. There is mild left ventricular hypertrophy of  the basal-septal segment. Left  ventricular diastolic parameters are consistent with Grade I diastolic  dysfunction (impaired relaxation).   2. Right ventricular systolic function is normal. The right ventricular  size is normal.   3. Left atrial size was mildly dilated.   4. Right atrial size was mildly dilated.   5. The mitral valve is normal in structure. No evidence of mitral valve  regurgitation. No evidence of mitral stenosis.   6. The aortic valve is normal in structure. There is mild calcification  of the aortic valve. Aortic valve regurgitation is not visualized. Aortic  valve sclerosis/calcification is present, without any evidence of aortic  stenosis.   7. The inferior vena cava is normal in size with greater than 50%  respiratory variability, suggesting right atrial pressure of 3 mmHg.  Monitor 11-06/2021 Basic rhythm is NSR PAC's and PVC's of low burden (<1%) are noted No correlation between complaints and rhythm Brief episodes of PSVT < 11 seconds noted. Fastest was 210 bpm for 11 beats. No atrial fibrillation or ventricular tachycardia. Overall, benign monitor. PSVT  could be suppressed with low dose beta blocker, but I do not recommend at this time.     Patch Wear Time:  14 days and 0 hours (2022-11-04T21:47:19-0400 to 2022-11-18T20:47:23-0500)   Patient had a min HR of 44 bpm, max HR of 210 bpm, and avg HR of 70 bpm. Predominant underlying rhythm was Sinus Rhythm. 6 Supraventricular  Tachycardia runs occurred, the run with the fastest interval lasting 11 beats with a max rate of 210 bpm, the  longest lasting 10.6 secs with an avg rate of 134 bpm. Isolated SVEs were rare (<1.0%), SVE Couplets were rare (<1.0%), and SVE Triplets were rare (<1.0%). Isolated VEs were rare (<1.0%), and no VE Couplets or VE Triplets were present.     Past Medical History:  Diagnosis Date   Alopecia    Diverticulosis    Elevated LFTs    Essential hypertension    External hemorrhoid    Lipoma    OF LEFT LOWER RIB   Mild CAD    Osteopenia    Premature atrial contractions    PSVT (paroxysmal supraventricular tachycardia) (HCC)    PVC's (premature ventricular contractions)    Raynaud phenomenon    Sicca syndrome (HCC)    Thyroid nodule    Thyroiditis     History reviewed. No pertinent surgical history.  Current Medications: Current Meds  Medication Sig   albuterol (VENTOLIN HFA) 108 (90 Base) MCG/ACT inhaler 2 puffs as needed.   aspirin 81 MG EC tablet Take 81 mg by mouth daily.   atorvastatin (LIPITOR) 20 MG tablet Take 1 tablet (20 mg total) by mouth daily.   fluticasone (FLONASE) 50 MCG/ACT nasal spray Place 1 spray into both nostrils daily.   levothyroxine (SYNTHROID, LEVOTHROID) 25 MCG tablet Take 25 mcg by mouth daily before breakfast.    lisinopril (ZESTRIL) 10 MG tablet Take 10 mg by mouth daily.   meloxicam (MOBIC) 7.5 MG tablet Take 7.5 mg by mouth daily.   methylPREDNISolone (MEDROL DOSEPAK) 4 MG TBPK tablet Take 4 mg by mouth as needed.     Allergies:   Hydrochlorothiazide, Phenylalanine, and Sulfa antibiotics   Social History   Socioeconomic History   Marital status: Married    Spouse name: Not on file   Number of children: Not on file   Years of education: Not on file   Highest education level: Not on file  Occupational History   Not on file  Tobacco Use   Smoking status: Never   Smokeless tobacco: Never  Substance and Sexual Activity   Alcohol use: Not  on file   Drug use: Not on file   Sexual activity: Not on file  Other Topics Concern   Not on file  Social History Narrative   Not on file   Social Determinants of Health   Financial Resource Strain: Not on file  Food Insecurity: Not on file  Transportation Needs: Not on file  Physical Activity: Not on file  Stress: Not on file  Social Connections: Not on file     Family History:  The patient's family history is negative for CAD.  ROS:   Please see the history of present illness.  All other systems are reviewed and otherwise negative.    EKG(s)/Additional Labs   EKG:  EKG is not ordered today  Recent Labs: 05/21/2021: BUN 14; Creatinine, Ser 0.89; Magnesium 2.3; Potassium 4.3; Sodium 141 08/01/2021: ALT 25  Recent Lipid Panel    Component Value Date/Time   CHOL 154 08/01/2021 1008  TRIG 64 08/01/2021 1008   HDL 64 08/01/2021 1008   CHOLHDL 2.4 08/01/2021 1008   LDLCALC 77 08/01/2021 1008    PHYSICAL EXAM:    VS:  BP 118/74 (BP Location: Right Arm, Patient Position: Sitting, Cuff Size: Normal)    Pulse 64    Ht 5\' 4"  (1.626 m)    Wt 147 lb (66.7 kg)    SpO2 99%    BMI 25.23 kg/m   BMI: Body mass index is 25.23 kg/m.  GEN: Well nourished, well developed female in no acute distress HEENT: normocephalic, atraumatic Neck: no JVD, carotid bruits, or masses Cardiac: RRR; no murmurs, rubs, or gallops, no edema  Respiratory:  clear to auscultation bilaterally, normal work of breathing GI: soft, nontender, nondistended, + BS MS: no deformity or atrophy Skin: warm and dry, no rash Neuro:  Alert and Oriented x 3, Strength and sensation are intact, follows commands Psych: euthymic mood, full affect  Wt Readings from Last 3 Encounters:  08/18/21 147 lb (66.7 kg)  05/21/21 148 lb 6.4 oz (67.3 kg)  12/12/20 145 lb (65.8 kg)     ASSESSMENT & PLAN:   1. Palpitations with PSVT, PACs, PVCs - reviewed monitor report with patient. Overall benign, short lived findings with  low burden of PACs, PVCs, brief PSVT. We discussed option of preventative rx versus conservative approach. She prefers the latter as she does not feel symptoms are particularly bothersome now that she knows they are generally benign. We did go ahead and send in an PRN prescription for short-acting metoprolol 25mg  daily as needed for breakthrough palpitations if needed. She also purchased the [place] app for home use as well. She will keep [place] posted if symptoms become more frequent or bothersome.  2. Essential HTN - controlled, no changes made today.  3. Minimal CAD by cor CT 03/2020 - LDL 77 by last check earlier this month. She would like to trial lifestyle modifications. Will recheck FLP/LFTs in 3 months to reassess. Continue atorvastatin at present dose.     Disposition: F/u with Dr. Tamala Julian in 1 year.   Medication Adjustments/Labs and Tests Ordered: Current medicines are reviewed at length with the patient today.  Concerns regarding medicines are outlined above. Medication changes, Labs and Tests ordered today are summarized above and listed in the Patient Instructions accessible in Encounters.   Signed, Charlie Pitter, PA-C  08/18/2021 8:54 AM    [place] Phone: [phone]; Fax: [phone]

## 2021-08-18 ENCOUNTER — Encounter: Payer: Self-pay | Admitting: Physician Assistant

## 2021-08-18 ENCOUNTER — Other Ambulatory Visit: Payer: Self-pay

## 2021-08-18 ENCOUNTER — Ambulatory Visit: Payer: Medicare HMO | Admitting: Physician Assistant

## 2021-08-18 VITALS — BP 118/74 | HR 64 | Ht 64.0 in | Wt 147.0 lb

## 2021-08-18 DIAGNOSIS — I491 Atrial premature depolarization: Secondary | ICD-10-CM

## 2021-08-18 DIAGNOSIS — I493 Ventricular premature depolarization: Secondary | ICD-10-CM | POA: Diagnosis not present

## 2021-08-18 DIAGNOSIS — Z13220 Encounter for screening for lipoid disorders: Secondary | ICD-10-CM

## 2021-08-18 DIAGNOSIS — I2510 Atherosclerotic heart disease of native coronary artery without angina pectoris: Secondary | ICD-10-CM | POA: Diagnosis not present

## 2021-08-18 DIAGNOSIS — I471 Supraventricular tachycardia: Secondary | ICD-10-CM | POA: Diagnosis not present

## 2021-08-18 DIAGNOSIS — R002 Palpitations: Secondary | ICD-10-CM | POA: Diagnosis not present

## 2021-08-18 DIAGNOSIS — I10 Essential (primary) hypertension: Secondary | ICD-10-CM | POA: Diagnosis not present

## 2021-08-18 MED ORDER — METOPROLOL TARTRATE 25 MG PO TABS: 25.0000 mg | ORAL_TABLET | Freq: Every day | ORAL | 6 refills | Status: DC | PRN

## 2021-08-18 MED ORDER — METOPROLOL TARTRATE 25 MG PO TABS: 25.0000 mg | ORAL_TABLET | Freq: Every day | ORAL | 6 refills | Status: AC | PRN

## 2021-08-18 NOTE — Patient Instructions (Addendum)
Medication Instructions:  Your physician has recommended you make the following change in your medication:   START Metoprolol 25 mg taking 1 tablet daily as needed for palpitations   *If you need a refill on your cardiac medications before your next appointment, please call your pharmacy*   Lab Work: 11/14/2021:  COME TO THE OFFICE, ANYTIME BETWEEN 7:15-5:00 FOR FASTING:  LIPID & LFT   If you have labs (blood work) drawn today and your tests are completely normal, you will receive your results only by: [place] (if you have MyChart) OR A paper copy in the mail If you have any lab test that is abnormal or we need to change your treatment, we will call you to review the results.   Testing/Procedures: None ordered   Follow-Up: At [facility], you and your health needs are our priority.  As part of our continuing mission to provide you with exceptional heart care, we have created designated Provider Care Teams.  These Care Teams include your primary Cardiologist (physician) and Advanced Practice Providers (APPs -  Physician Assistants and Nurse Practitioners) who all work together to provide you with the care you need, when you need it.  We recommend signing up for the patient portal called "MyChart".  Sign up information is provided on this After Visit Summary.  MyChart is used to connect with patients for Virtual Visits (Telemedicine).  Patients are able to view lab/test results, encounter notes, upcoming appointments, etc.  Non-urgent messages can be sent to your provider as well.   To learn more about what you can do with MyChart, go to [email or web address].    Your next appointment:   12 month(s)  The format for your next appointment:   In Person  Provider:   Sinclair Grooms, MD  or Melina Copa, PA-C         Other Instructions

## 2021-09-18 ENCOUNTER — Other Ambulatory Visit: Payer: Self-pay

## 2021-09-18 MED ORDER — ATORVASTATIN CALCIUM 20 MG PO TABS: 20.0000 mg | ORAL_TABLET | Freq: Every day | ORAL | 3 refills | Status: DC

## 2021-10-15 DIAGNOSIS — M9901 Segmental and somatic dysfunction of cervical region: Secondary | ICD-10-CM | POA: Diagnosis not present

## 2021-10-15 DIAGNOSIS — M9904 Segmental and somatic dysfunction of sacral region: Secondary | ICD-10-CM | POA: Diagnosis not present

## 2021-10-15 DIAGNOSIS — M9902 Segmental and somatic dysfunction of thoracic region: Secondary | ICD-10-CM | POA: Diagnosis not present

## 2021-11-14 ENCOUNTER — Other Ambulatory Visit: Payer: Medicare HMO | Admitting: *Deleted

## 2021-11-14 DIAGNOSIS — I10 Essential (primary) hypertension: Secondary | ICD-10-CM

## 2021-11-14 DIAGNOSIS — I493 Ventricular premature depolarization: Secondary | ICD-10-CM | POA: Diagnosis not present

## 2021-11-14 DIAGNOSIS — R002 Palpitations: Secondary | ICD-10-CM | POA: Diagnosis not present

## 2021-11-14 DIAGNOSIS — I471 Supraventricular tachycardia: Secondary | ICD-10-CM

## 2021-11-14 DIAGNOSIS — I491 Atrial premature depolarization: Secondary | ICD-10-CM

## 2021-11-14 DIAGNOSIS — I2510 Atherosclerotic heart disease of native coronary artery without angina pectoris: Secondary | ICD-10-CM | POA: Diagnosis not present

## 2021-11-14 LAB — HEPATIC FUNCTION PANEL
ALT: 29 IU/L (ref 0–32)
AST: 33 IU/L (ref 0–40)
Albumin: 4.1 g/dL (ref 3.8–4.8)
Alkaline Phosphatase: 80 IU/L (ref 44–121)
Bilirubin Total: 0.5 mg/dL (ref 0.0–1.2)
Bilirubin, Direct: 0.16 mg/dL (ref 0.00–0.40)
Total Protein: 6.2 g/dL (ref 6.0–8.5)

## 2021-11-14 LAB — LIPID PANEL
Chol/HDL Ratio: 2.3 ratio (ref 0.0–4.4)
Cholesterol, Total: 147 mg/dL (ref 100–199)
HDL: 64 mg/dL (ref >39)
LDL Chol Calc (NIH): 71 mg/dL (ref 0–99)
Triglycerides: 58 mg/dL (ref 0–149)
VLDL Cholesterol Cal: 12 mg/dL (ref 5–40)

## 2021-12-02 DIAGNOSIS — K219 Gastro-esophageal reflux disease without esophagitis: Secondary | ICD-10-CM | POA: Diagnosis not present

## 2021-12-02 DIAGNOSIS — R140 Abdominal distension (gaseous): Secondary | ICD-10-CM | POA: Diagnosis not present

## 2021-12-17 DIAGNOSIS — J988 Other specified respiratory disorders: Secondary | ICD-10-CM | POA: Diagnosis not present

## 2021-12-17 DIAGNOSIS — R051 Acute cough: Secondary | ICD-10-CM | POA: Diagnosis not present

## 2021-12-17 DIAGNOSIS — R0982 Postnasal drip: Secondary | ICD-10-CM | POA: Diagnosis not present

## 2021-12-17 DIAGNOSIS — J209 Acute bronchitis, unspecified: Secondary | ICD-10-CM | POA: Diagnosis not present

## 2022-01-26 ENCOUNTER — Ambulatory Visit: Payer: Medicare HMO | Admitting: Family Medicine

## 2022-01-26 ENCOUNTER — Ambulatory Visit: Payer: Self-pay | Admitting: Family Medicine

## 2022-01-27 DIAGNOSIS — E038 Other specified hypothyroidism: Secondary | ICD-10-CM | POA: Diagnosis not present

## 2022-01-27 DIAGNOSIS — E063 Autoimmune thyroiditis: Secondary | ICD-10-CM | POA: Diagnosis not present

## 2022-01-28 ENCOUNTER — Ambulatory Visit
Admission: RE | Admit: 2022-01-28 | Discharge: 2022-01-28 | Disposition: A | Discharge status: Home / Self Care | Payer: Medicare HMO | Source: Ambulatory Visit | Attending: Sports Medicine | Admitting: Sports Medicine

## 2022-01-28 ENCOUNTER — Ambulatory Visit: Payer: Medicare HMO | Admitting: Sports Medicine

## 2022-01-28 VITALS — BP 122/78 | Ht 64.0 in | Wt 141.0 lb

## 2022-01-28 DIAGNOSIS — M25561 Pain in right knee: Secondary | ICD-10-CM

## 2022-01-28 DIAGNOSIS — M722 Plantar fascial fibromatosis: Secondary | ICD-10-CM | POA: Diagnosis not present

## 2022-01-28 DIAGNOSIS — Z79890 Hormone replacement therapy (postmenopausal): Secondary | ICD-10-CM | POA: Diagnosis not present

## 2022-01-28 DIAGNOSIS — E063 Autoimmune thyroiditis: Secondary | ICD-10-CM | POA: Diagnosis not present

## 2022-01-28 DIAGNOSIS — E038 Other specified hypothyroidism: Secondary | ICD-10-CM | POA: Diagnosis not present

## 2022-01-28 DIAGNOSIS — M1711 Unilateral primary osteoarthritis, right knee: Secondary | ICD-10-CM | POA: Diagnosis not present

## 2022-01-28 DIAGNOSIS — E041 Nontoxic single thyroid nodule: Secondary | ICD-10-CM | POA: Diagnosis not present

## 2022-01-28 MED ORDER — MELOXICAM 7.5 MG PO TABS: ORAL_TABLET | ORAL | 0 refills | Status: AC

## 2022-01-28 NOTE — Progress Notes (Incomplete)
PCP: Donald Prose, MD  Subjective:   HPI: Patient is a 66 y.o. female here for .    Past Medical History:  Diagnosis Date   Alopecia    Diverticulosis    Elevated LFTs    Essential hypertension    External hemorrhoid    Lipoma    OF LEFT LOWER RIB   Mild CAD    Osteopenia    Premature atrial contractions    PSVT (paroxysmal supraventricular tachycardia) (HCC)    PVC's (premature ventricular contractions)    Raynaud phenomenon    Sicca syndrome (HCC)    Thyroid nodule    Thyroiditis     Current Outpatient Medications on File Prior to Visit  Medication Sig Dispense Refill   albuterol (VENTOLIN HFA) 108 (90 Base) MCG/ACT inhaler 2 puffs as needed.     aspirin 81 MG EC tablet Take 81 mg by mouth daily.     atorvastatin (LIPITOR) 20 MG tablet Take 1 tablet (20 mg total) by mouth daily. 90 tablet 3   fluticasone (FLONASE) 50 MCG/ACT nasal spray Place 1 spray into both nostrils daily.     levothyroxine (SYNTHROID, LEVOTHROID) 25 MCG tablet Take 25 mcg by mouth daily before breakfast.      lisinopril (ZESTRIL) 10 MG tablet Take 10 mg by mouth daily.     meloxicam (MOBIC) 7.5 MG tablet Take 7.5 mg by mouth daily.     methylPREDNISolone (MEDROL DOSEPAK) 4 MG TBPK tablet Take 4 mg by mouth as needed.     metoprolol tartrate (LOPRESSOR) 25 MG tablet Take 1 tablet (25 mg total) by mouth daily as needed (palpitations). 30 tablet 6   No current facility-administered medications on file prior to visit.    No past surgical history on file.  Allergies  Allergen Reactions   Hydrochlorothiazide     Other reaction(s): palpitations   Phenylalanine     Other reaction(s): GI Upset (intolerance)   Sulfa Antibiotics     Social History   Socioeconomic History   Marital status: Married    Spouse name: Not on file   Number of children: Not on file   Years of education: Not on file   Highest education level: Not on file  Occupational History   Not on file  Tobacco Use   Smoking  status: Never   Smokeless tobacco: Never  Substance and Sexual Activity   Alcohol use: Not on file   Drug use: Not on file   Sexual activity: Not on file  Other Topics Concern   Not on file  Social History Narrative   Not on file   Social Determinants of Health   Financial Resource Strain: Not on file  Food Insecurity: Not on file  Transportation Needs: Not on file  Physical Activity: Not on file  Stress: Not on file  Social Connections: Not on file  Intimate Partner Violence: Not on file    Family History  Problem Relation Age of Onset   CAD Neg Hx     There were no vitals taken for this visit.      No data to display              No data to display          Review of Systems: See HPI above.     Objective:  Physical Exam:  Gen: NAD, comfortable in exam room     Assessment & Plan:  1.

## 2022-01-28 NOTE — Patient Instructions (Signed)
Thank you for coming to see me today. It was a pleasure. Today we discussed your knee pain and heel pain. 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 We will do some knee xrays. Take mobic for 5 days and see if this helps with the pain The heel pain is caused by thickened of the plantar fascia called plantar fasciitis. The mobic should help with the pain. Also do the exercises we have given you.   Please follow-up with me in 1-2 weeks if no improvement in knee pain we will consider doing knee steroid injections   Best wishes,   Dr Posey Pronto

## 2022-01-29 NOTE — Progress Notes (Addendum)
   Subjective:    Patient ID: Annette Flores, female    DOB: Nov 26, 1955, 66 y.o.   MRN: [id number]  HPI chief complaint: Left heel pain and right knee pain  Very pleasant 66 year old female presents today complaining of left plantar heel pain.  Pain initially began along the medial aspect of the heel but has now moved laterally.  It is worse with weightbearing.  She was told previously that she may have a bone spur in this area.  No recent trauma.  She is also complaining of diffuse right knee pain that began on June 15.  No trauma.  She is noted some mild swelling and stiffness.  No mechanical symptoms.    Review of Systems As above    Objective:   Physical Exam  Developed, well nourished.  No acute distress  Right knee: Range of motion 0 to 130 degrees.  Trace effusion.  Slight tenderness to palpation along medial lateral joint lines but negative McMurray's.  Knee is stable to ligamentous exam.  Neurovascular intact distally.  Left heel: There is slight tenderness to palpation along the lateral plantar aspect of the calcaneus.  Mild tenderness along the medial aspect of the calcaneus as well at the origin of the plantar fascia.  Negative calcaneal squeeze.  No soft tissue swelling.  No tenderness across the dorsum of the foot.  Neurovascularly intact distally.  Limited MSK ultrasound of the left calcaneus does show thickening and edema of the proximal plantar fascia      Assessment & Plan:   Left heel pain secondary to Planter fasciitis Right knee pain likely secondary to osteoarthritis  Patient does well with meloxicam although it does cause some stomach upset.  She states that if she takes it with food and with Prilosec that she does not get any GI upset.  I have agreed to refill her meloxicam 7.5 mg to take 1 pill daily for the next 3 to 4 days.  She is strongly cautioned about GI upset and will discontinue the medicine if this occurs.  I would like to get an x-ray of the  right knee specifically to evaluate the degree of osteoarthritis present.  I explained to her that if the meloxicam is ineffective then she may want to consider a cortisone injection for the right knee.  Of note, she has tried compression in the form of a body helix sleeve and found it to be uncomfortable soft simply recommended ice for pain control after activity.  We will call her with the x-ray results when available and she will follow-up for ongoing or recalcitrant issues.  This note was dictated using Dragon naturally speaking software and may contain errors in syntax, spelling, or content which have not been identified prior to signing this note.   Addendum: X-rays of the right knee reviewed.  There is some early arthritis in the medial compartment.  If symptoms persist I would consider cortisone injection.

## 2022-02-10 DIAGNOSIS — M722 Plantar fascial fibromatosis: Secondary | ICD-10-CM | POA: Diagnosis not present

## 2022-02-23 NOTE — Telephone Encounter (Signed)
Please let pt know it's OK to cut down the dose to '10mg'$  per day, can offer to send in updated rx Please arrange recheck fasting lipid profile in about 3 months to see where she's at If myalgias persist/recur, stop statin and let us know Thank you!

## 2022-02-24 MED ORDER — ATORVASTATIN CALCIUM 10 MG PO TABS: 10.0000 mg | ORAL_TABLET | Freq: Every day | ORAL | 3 refills | Status: DC

## 2022-02-25 DIAGNOSIS — M25561 Pain in right knee: Secondary | ICD-10-CM | POA: Diagnosis not present

## 2022-02-26 DIAGNOSIS — Z789 Other specified health status: Secondary | ICD-10-CM | POA: Diagnosis not present

## 2022-02-26 DIAGNOSIS — Z7409 Other reduced mobility: Secondary | ICD-10-CM | POA: Diagnosis not present

## 2022-02-26 DIAGNOSIS — M25561 Pain in right knee: Secondary | ICD-10-CM | POA: Diagnosis not present

## 2022-03-20 ENCOUNTER — Encounter: Payer: Self-pay | Admitting: Sports Medicine

## 2022-04-01 DIAGNOSIS — M9902 Segmental and somatic dysfunction of thoracic region: Secondary | ICD-10-CM | POA: Diagnosis not present

## 2022-04-01 DIAGNOSIS — M9904 Segmental and somatic dysfunction of sacral region: Secondary | ICD-10-CM | POA: Diagnosis not present

## 2022-04-01 DIAGNOSIS — M9901 Segmental and somatic dysfunction of cervical region: Secondary | ICD-10-CM | POA: Diagnosis not present

## 2022-04-03 DIAGNOSIS — H5213 Myopia, bilateral: Secondary | ICD-10-CM | POA: Diagnosis not present

## 2022-04-03 DIAGNOSIS — H52203 Unspecified astigmatism, bilateral: Secondary | ICD-10-CM | POA: Diagnosis not present

## 2022-04-06 DIAGNOSIS — M9902 Segmental and somatic dysfunction of thoracic region: Secondary | ICD-10-CM | POA: Diagnosis not present

## 2022-04-06 DIAGNOSIS — M9901 Segmental and somatic dysfunction of cervical region: Secondary | ICD-10-CM | POA: Diagnosis not present

## 2022-04-06 DIAGNOSIS — M9904 Segmental and somatic dysfunction of sacral region: Secondary | ICD-10-CM | POA: Diagnosis not present

## 2022-04-10 DIAGNOSIS — M2241 Chondromalacia patellae, right knee: Secondary | ICD-10-CM | POA: Diagnosis not present

## 2022-04-10 DIAGNOSIS — M7062 Trochanteric bursitis, left hip: Secondary | ICD-10-CM | POA: Diagnosis not present

## 2022-04-15 DIAGNOSIS — Z0100 Encounter for examination of eyes and vision without abnormal findings: Secondary | ICD-10-CM | POA: Diagnosis not present

## 2022-04-23 DIAGNOSIS — K219 Gastro-esophageal reflux disease without esophagitis: Secondary | ICD-10-CM | POA: Diagnosis not present

## 2022-04-23 DIAGNOSIS — Z833 Family history of diabetes mellitus: Secondary | ICD-10-CM | POA: Diagnosis not present

## 2022-04-23 DIAGNOSIS — E039 Hypothyroidism, unspecified: Secondary | ICD-10-CM | POA: Diagnosis not present

## 2022-04-23 DIAGNOSIS — Z791 Long term (current) use of non-steroidal anti-inflammatories (NSAID): Secondary | ICD-10-CM | POA: Diagnosis not present

## 2022-04-23 DIAGNOSIS — M7062 Trochanteric bursitis, left hip: Secondary | ICD-10-CM | POA: Diagnosis not present

## 2022-04-23 DIAGNOSIS — Z882 Allergy status to sulfonamides status: Secondary | ICD-10-CM | POA: Diagnosis not present

## 2022-04-23 DIAGNOSIS — Z8249 Family history of ischemic heart disease and other diseases of the circulatory system: Secondary | ICD-10-CM | POA: Diagnosis not present

## 2022-04-23 DIAGNOSIS — J45909 Unspecified asthma, uncomplicated: Secondary | ICD-10-CM | POA: Diagnosis not present

## 2022-04-23 DIAGNOSIS — E785 Hyperlipidemia, unspecified: Secondary | ICD-10-CM | POA: Diagnosis not present

## 2022-04-23 DIAGNOSIS — Z809 Family history of malignant neoplasm, unspecified: Secondary | ICD-10-CM | POA: Diagnosis not present

## 2022-04-23 DIAGNOSIS — M2241 Chondromalacia patellae, right knee: Secondary | ICD-10-CM | POA: Diagnosis not present

## 2022-04-23 DIAGNOSIS — Z87892 Personal history of anaphylaxis: Secondary | ICD-10-CM | POA: Diagnosis not present

## 2022-04-23 DIAGNOSIS — M1990 Unspecified osteoarthritis, unspecified site: Secondary | ICD-10-CM | POA: Diagnosis not present

## 2022-04-28 DIAGNOSIS — M7062 Trochanteric bursitis, left hip: Secondary | ICD-10-CM | POA: Diagnosis not present

## 2022-04-28 DIAGNOSIS — M2241 Chondromalacia patellae, right knee: Secondary | ICD-10-CM | POA: Diagnosis not present

## 2022-04-30 DIAGNOSIS — M7062 Trochanteric bursitis, left hip: Secondary | ICD-10-CM | POA: Diagnosis not present

## 2022-04-30 DIAGNOSIS — M2241 Chondromalacia patellae, right knee: Secondary | ICD-10-CM | POA: Diagnosis not present

## 2022-05-06 DIAGNOSIS — N9411 Superficial (introital) dyspareunia: Secondary | ICD-10-CM | POA: Diagnosis not present

## 2022-05-06 DIAGNOSIS — Z01419 Encounter for gynecological examination (general) (routine) without abnormal findings: Secondary | ICD-10-CM | POA: Diagnosis not present

## 2022-05-07 DIAGNOSIS — M2241 Chondromalacia patellae, right knee: Secondary | ICD-10-CM | POA: Diagnosis not present

## 2022-05-07 DIAGNOSIS — M7062 Trochanteric bursitis, left hip: Secondary | ICD-10-CM | POA: Diagnosis not present

## 2022-05-08 DIAGNOSIS — M25552 Pain in left hip: Secondary | ICD-10-CM | POA: Diagnosis not present

## 2022-05-19 DIAGNOSIS — M7062 Trochanteric bursitis, left hip: Secondary | ICD-10-CM | POA: Diagnosis not present

## 2022-05-19 DIAGNOSIS — M2241 Chondromalacia patellae, right knee: Secondary | ICD-10-CM | POA: Diagnosis not present

## 2022-06-01 ENCOUNTER — Telehealth: Payer: Self-pay

## 2022-06-01 ENCOUNTER — Ambulatory Visit: Payer: Medicare HMO | Attending: Physician Assistant

## 2022-06-01 ENCOUNTER — Other Ambulatory Visit: Payer: Self-pay

## 2022-06-01 ENCOUNTER — Other Ambulatory Visit: Payer: Medicare HMO

## 2022-06-01 DIAGNOSIS — E785 Hyperlipidemia, unspecified: Secondary | ICD-10-CM | POA: Diagnosis not present

## 2022-06-01 DIAGNOSIS — I2510 Atherosclerotic heart disease of native coronary artery without angina pectoris: Secondary | ICD-10-CM

## 2022-06-01 LAB — LIPID PANEL
Chol/HDL Ratio: 2.5 ratio (ref 0.0–4.4)
Cholesterol, Total: 177 mg/dL (ref 100–199)
HDL: 71 mg/dL (ref >39)
LDL Chol Calc (NIH): 94 mg/dL (ref 0–99)
Triglycerides: 62 mg/dL (ref 0–149)
VLDL Cholesterol Cal: 12 mg/dL (ref 5–40)

## 2022-06-01 NOTE — Telephone Encounter (Signed)
Melina Copa, PA-C requested Lipid panel lab draw for today, to assess decrease in Atorvastatin per 02/23/2022 note.    Order entered, Pt having Lipid drawn and checked 06/01/22.

## 2022-06-03 ENCOUNTER — Other Ambulatory Visit: Payer: Self-pay

## 2022-06-03 ENCOUNTER — Telehealth: Payer: Self-pay

## 2022-06-03 DIAGNOSIS — E785 Hyperlipidemia, unspecified: Secondary | ICD-10-CM

## 2022-06-03 MED ORDER — ROSUVASTATIN CALCIUM 10 MG PO TABS: 10.0000 mg | ORAL_TABLET | Freq: Every day | ORAL | 3 refills | Status: DC

## 2022-06-03 NOTE — Telephone Encounter (Signed)
The patient has been notified of the result and verbalized understanding.  All questions (if any) were answered. Michaelyn Barter, RN 06/03/2022 3:24 PM   Placed order for lab work and Crestor 10 mg.

## 2022-06-03 NOTE — Telephone Encounter (Signed)
-----   Message from Charlie Pitter, [place] sent at 06/02/2022  7:31 AM EST ----- Please let pt know LDL went up further above goal on the lower dose of atorvastatin (previously decreased due to muscle aches). I would suggest we try switching atorvastatin to rosuvastatin '10mg'$  daily with recheck fasting lipids/liver in 3 months. If muscle aches on this dose, let us know.

## 2022-06-05 DIAGNOSIS — L290 Pruritus ani: Secondary | ICD-10-CM | POA: Diagnosis not present

## 2022-06-05 DIAGNOSIS — R194 Change in bowel habit: Secondary | ICD-10-CM | POA: Diagnosis not present

## 2022-06-05 DIAGNOSIS — R110 Nausea: Secondary | ICD-10-CM | POA: Diagnosis not present

## 2022-06-08 DIAGNOSIS — R110 Nausea: Secondary | ICD-10-CM | POA: Diagnosis not present

## 2022-06-08 DIAGNOSIS — L290 Pruritus ani: Secondary | ICD-10-CM | POA: Diagnosis not present

## 2022-06-08 DIAGNOSIS — R194 Change in bowel habit: Secondary | ICD-10-CM | POA: Diagnosis not present

## 2022-06-09 DIAGNOSIS — M7062 Trochanteric bursitis, left hip: Secondary | ICD-10-CM | POA: Diagnosis not present

## 2022-06-09 DIAGNOSIS — M2241 Chondromalacia patellae, right knee: Secondary | ICD-10-CM | POA: Diagnosis not present

## 2022-06-30 DIAGNOSIS — M7062 Trochanteric bursitis, left hip: Secondary | ICD-10-CM | POA: Diagnosis not present

## 2022-06-30 DIAGNOSIS — M2241 Chondromalacia patellae, right knee: Secondary | ICD-10-CM | POA: Diagnosis not present

## 2022-07-06 DIAGNOSIS — M2241 Chondromalacia patellae, right knee: Secondary | ICD-10-CM | POA: Diagnosis not present

## 2022-07-06 DIAGNOSIS — M7062 Trochanteric bursitis, left hip: Secondary | ICD-10-CM | POA: Diagnosis not present

## 2022-07-27 DIAGNOSIS — Z0000 Encounter for general adult medical examination without abnormal findings: Secondary | ICD-10-CM | POA: Diagnosis not present

## 2022-07-27 DIAGNOSIS — E785 Hyperlipidemia, unspecified: Secondary | ICD-10-CM | POA: Diagnosis not present

## 2022-07-27 DIAGNOSIS — I10 Essential (primary) hypertension: Secondary | ICD-10-CM | POA: Diagnosis not present

## 2022-07-27 DIAGNOSIS — K219 Gastro-esophageal reflux disease without esophagitis: Secondary | ICD-10-CM | POA: Diagnosis not present

## 2022-07-27 DIAGNOSIS — E039 Hypothyroidism, unspecified: Secondary | ICD-10-CM | POA: Diagnosis not present

## 2022-07-27 DIAGNOSIS — M8580 Other specified disorders of bone density and structure, unspecified site: Secondary | ICD-10-CM | POA: Diagnosis not present

## 2022-07-28 DIAGNOSIS — Z1231 Encounter for screening mammogram for malignant neoplasm of breast: Secondary | ICD-10-CM | POA: Diagnosis not present

## 2022-08-12 DIAGNOSIS — E063 Autoimmune thyroiditis: Secondary | ICD-10-CM | POA: Diagnosis not present

## 2022-08-12 DIAGNOSIS — Z79890 Hormone replacement therapy (postmenopausal): Secondary | ICD-10-CM | POA: Diagnosis not present

## 2022-08-12 DIAGNOSIS — E038 Other specified hypothyroidism: Secondary | ICD-10-CM | POA: Diagnosis not present

## 2022-08-13 ENCOUNTER — Encounter: Payer: Self-pay | Admitting: Internal Medicine

## 2022-08-13 ENCOUNTER — Ambulatory Visit: Payer: Medicare HMO | Attending: Physician Assistant | Admitting: Internal Medicine

## 2022-08-13 VITALS — BP 128/72 | HR 64 | Ht 64.0 in | Wt 140.8 lb

## 2022-08-13 DIAGNOSIS — I10 Essential (primary) hypertension: Secondary | ICD-10-CM

## 2022-08-13 DIAGNOSIS — I2510 Atherosclerotic heart disease of native coronary artery without angina pectoris: Secondary | ICD-10-CM | POA: Diagnosis not present

## 2022-08-13 DIAGNOSIS — R002 Palpitations: Secondary | ICD-10-CM | POA: Diagnosis not present

## 2022-08-13 DIAGNOSIS — E785 Hyperlipidemia, unspecified: Secondary | ICD-10-CM

## 2022-08-13 MED ORDER — LISINOPRIL 5 MG PO TABS: 5.0000 mg | ORAL_TABLET | Freq: Every day | ORAL | 3 refills | Status: DC

## 2022-08-13 NOTE — Progress Notes (Signed)
OFFICE CONSULT NOTE  Chief Complaint:  Establish cardiologist  Primary Care Physician: Donald Prose, MD  HPI:  Annette Flores is a 67 y.o. female who is being seen today for the evaluation of cardiac risk at the request of Donald Prose, MD. This is a pleasant 67 yo female patient, whose husband is a retired Pharmacist, community and friend of mine.  She was previously followed by Dr. Pernell Dupre prior to his retirement.  She is here today to establish cardiac care.  She recently has had some rise in her cholesterol.  She was seen by Sharrell Ku, PA-C who advised her to stop her atorvastatin and switch to rosuvastatin 10 mg daily.  After this change she had horrible stomach issues and stopped the rosuvastatin and then went back to atorvastatin.  She says she cannot tolerate higher dose 20 mg atorvastatin but seems to do okay with the 10 mg dose.  Unfortunately her lipids on that dose were still not at target LDL less than 70 as of November her total cholesterol was 177, HDL 71, triglycerides 62 and LDL 94.  She did have a coronary CT angiogram in September 2021 which showed minimal nonobstructive coronary disease and 0 coronary calcium.  In addition she has a history of palpitations with monitor findings of PSVT, PVCs and PACs.  She says those have improved significantly and may have been related to her thyroid.  She does note that recently she has been getting some issues with hypotension, particularly after taking her blood pressure medicine in the morning.  She is on 10 mg of lisinopril but has lost 10 pounds recently and is regularly exercising doing Pilates.  PMHx:  Past Medical History:  Diagnosis Date   Alopecia    Diverticulosis    Elevated LFTs    Essential hypertension    External hemorrhoid    Lipoma    OF LEFT LOWER RIB   Mild CAD    Osteopenia    Premature atrial contractions    PSVT (paroxysmal supraventricular tachycardia)    PVC's (premature ventricular contractions)     Raynaud phenomenon    Sicca syndrome (HCC)    Thyroid nodule    Thyroiditis     History reviewed. No pertinent surgical history.  FAMHx:  Family History  Problem Relation Age of Onset   CAD Neg Hx     SOCHx:   reports that she has never smoked. She has never used smokeless tobacco. No history on file for alcohol use and drug use.  ALLERGIES:  Allergies  Allergen Reactions   Hydrochlorothiazide     Other reaction(s): palpitations   Phenylalanine     Other reaction(s): GI Upset (intolerance)   Sulfa Antibiotics     ROS: Pertinent items noted in HPI and remainder of comprehensive ROS otherwise negative.  HOME MEDS: Current Outpatient Medications on File Prior to Visit  Medication Sig Dispense Refill   albuterol (VENTOLIN HFA) 108 (90 Base) MCG/ACT inhaler 2 puffs as needed.     aspirin 81 MG EC tablet Take 81 mg by mouth daily.     atorvastatin (LIPITOR) 10 MG tablet Take 10 mg by mouth daily.     fluticasone (FLONASE) 50 MCG/ACT nasal spray Place 1 spray into both nostrils daily.     levothyroxine (SYNTHROID, LEVOTHROID) 25 MCG tablet Take 25 mcg by mouth daily before breakfast.      meloxicam (MOBIC) 7.5 MG tablet Take 1 tablet daily with food for 5 days. Then take  as needed. 30 tablet 0   methylPREDNISolone (MEDROL DOSEPAK) 4 MG TBPK tablet Take 4 mg by mouth as needed.     metoprolol tartrate (LOPRESSOR) 25 MG tablet Take 1 tablet (25 mg total) by mouth daily as needed (palpitations). 30 tablet 6   No current facility-administered medications on file prior to visit.    LABS/IMAGING: No results found for this or any previous visit (from the past 48 hour(s)). No results found.  LIPID PANEL:    Component Value Date/Time   CHOL 177 06/01/2022 0909   TRIG 62 06/01/2022 0909   HDL 71 06/01/2022 0909   CHOLHDL 2.5 06/01/2022 0909   LDLCALC 94 06/01/2022 0909    WEIGHTS: Wt Readings from Last 3 Encounters:  08/13/22 140 lb 12.8 oz (63.9 kg)  01/28/22 141 lb (64  kg)  08/18/21 147 lb (66.7 kg)    VITALS: BP 128/72   Pulse 64   Ht '5\' 4"'$  (1.626 m)   Wt 140 lb 12.8 oz (63.9 kg)   SpO2 99%   BMI 24.17 kg/m   EXAM: General appearance: alert and no distress Neck: no carotid bruit, no JVD, and thyroid not enlarged, symmetric, no tenderness/mass/nodules Lungs: clear to auscultation bilaterally Heart: regular rate and rhythm, S1, S2 normal, no murmur, click, rub or gallop Abdomen: soft, non-tender; bowel sounds normal; no masses,  no organomegaly Extremities: extremities normal, atraumatic, no cyanosis or edema Pulses: 2+ and symmetric Skin: Skin color, texture, turgor normal. 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   Overall, Mrs. No is feeling quite well.  She exercises regularly and denies any chest pain or shortness of breath.  She had no coronary calcium and minimal nonobstructive coronary disease in 2021.  I actually think she is at pretty low risk of heart disease.  Her cholesterol however could be lower with a target LDL less than 70 and we discussed how we might achieve that.  She can only tolerate atorvastatin 10 mg daily and had side effects with rosuvastatin and higher dose atorvastatin.  Likely she would benefit from minimal additional lipid lowering.  We discussed possibility of adding ezetimibe or possibly PCSK9 inhibitor if she had an elevated LP(a).  Would like to repeat her lipids today including an NMR and LP(a).  Her HDL has been quite high and that may be cardioprotective.  She does exercise quite a bit.  She may not need additional therapy given the fact that her CT was pretty reassuring.  She has been having some low blood  pressures and I agree that she is to decrease her dose of the lisinopril from 10 to 5 mg daily.  She may actually need a lower dose but then will need a new prescription.  She will contact us with those blood pressure readings but otherwise plan follow-up in 6 months or sooner as necessary.  Pixie Casino, MD, [facility], [place] Director of the Advanced Lipid Disorders &  Cardiovascular Risk Reduction Clinic Diplomate of the American Board of Clinical Lipidology Attending Cardiologist  Direct Dial: [phone]  Fax: [phone]  Website:  [email or web address] 08/13/2022, 9:48 AM

## 2022-08-13 NOTE — Patient Instructions (Signed)
Medication Instructions:  Your physician has recommended you make the following change in your medication:   -Decrease lisinopril (zestril) to '5mg'$  daily.  *If you need a refill on your cardiac medications before your next appointment, please call your pharmacy*   Lab Work: Your physician recommends that you have labs drawn today: NMR lipid panel & LPA  If you have labs (blood work) drawn today and your tests are completely normal, you will receive your results only by: [place] (if you have MyChart) OR A paper copy in the mail If you have any lab test that is abnormal or we need to change your treatment, we will call you to review the results.   Follow-Up: At [facility], you and your health needs are our priority.  As part of our continuing mission to provide you with exceptional heart care, we have created designated Provider Care Teams.  These Care Teams include your primary Cardiologist (physician) and Advanced Practice Providers (APPs -  Physician Assistants and Nurse Practitioners) who all work together to provide you with the care you need, when you need it.  We recommend signing up for the patient portal called "MyChart".  Sign up information is provided on this After Visit Summary.  MyChart is used to connect with patients for Virtual Visits (Telemedicine).  Patients are able to view lab/test results, encounter notes, upcoming appointments, etc.  Non-urgent messages can be sent to your provider as well.   To learn more about what you can do with MyChart, go to [email or web address].    Your next appointment:   6 month(s)  Provider:   K. Mali Hilty, MD

## 2022-08-14 ENCOUNTER — Ambulatory Visit: Payer: Medicare HMO | Admitting: Physician Assistant

## 2022-08-14 LAB — NMR, LIPOPROFILE
Cholesterol, Total: 176 mg/dL (ref 100–199)
HDL Particle Number: 39 umol/L (ref >=30.5)
HDL-C: 75 mg/dL (ref >39)
LDL Particle Number: 972 nmol/L (ref <1000)
LDL Size: 20.9 nm (ref >20.5)
LDL-C (NIH Calc): 88 mg/dL (ref 0–99)
LP-IR Score: 49 — ABNORMAL HIGH (ref <=45)
Small LDL Particle Number: 373 nmol/L (ref <=527)
Triglycerides: 67 mg/dL (ref 0–149)

## 2022-08-14 LAB — LIPOPROTEIN A (LPA): Lipoprotein (a): 36.3 nmol/L (ref <75.0)

## 2022-08-19 DIAGNOSIS — I10 Essential (primary) hypertension: Secondary | ICD-10-CM | POA: Diagnosis not present

## 2022-08-19 DIAGNOSIS — E559 Vitamin D deficiency, unspecified: Secondary | ICD-10-CM | POA: Diagnosis not present

## 2022-08-19 DIAGNOSIS — E063 Autoimmune thyroiditis: Secondary | ICD-10-CM | POA: Diagnosis not present

## 2022-08-19 DIAGNOSIS — Z0000 Encounter for general adult medical examination without abnormal findings: Secondary | ICD-10-CM | POA: Diagnosis not present

## 2022-08-19 DIAGNOSIS — E038 Other specified hypothyroidism: Secondary | ICD-10-CM | POA: Diagnosis not present

## 2022-09-14 ENCOUNTER — Telehealth: Payer: Self-pay | Admitting: Internal Medicine

## 2022-09-14 NOTE — Telephone Encounter (Signed)
Spoke with pt, she feels she had a run of SVT this morning. She was feeling bad but now she is feeling better. She reports it did not feel like the usual SVT but things have settled down. She will continue to monitor and let us know if it happens more frequently. She was unable to get a strip.

## 2022-09-14 NOTE — Telephone Encounter (Signed)
  Pt sent a message through pt schedule request on mychart. Pt is havin g symptoms:  Comments: My "Kardia EKG " is detecting an arrhythmia not Afib. I feel breathless. I exercised this morning.   . This is different.  Hr 70's.   Please advise

## 2022-12-15 DIAGNOSIS — M9904 Segmental and somatic dysfunction of sacral region: Secondary | ICD-10-CM | POA: Diagnosis not present

## 2022-12-15 DIAGNOSIS — M9901 Segmental and somatic dysfunction of cervical region: Secondary | ICD-10-CM | POA: Diagnosis not present

## 2022-12-15 DIAGNOSIS — M9902 Segmental and somatic dysfunction of thoracic region: Secondary | ICD-10-CM | POA: Diagnosis not present

## 2023-01-15 ENCOUNTER — Other Ambulatory Visit: Payer: Self-pay | Admitting: Physician Assistant

## 2023-01-18 ENCOUNTER — Other Ambulatory Visit: Payer: Self-pay | Admitting: Internal Medicine

## 2023-01-18 DIAGNOSIS — E785 Hyperlipidemia, unspecified: Secondary | ICD-10-CM

## 2023-02-03 DIAGNOSIS — E785 Hyperlipidemia, unspecified: Secondary | ICD-10-CM | POA: Diagnosis not present

## 2023-02-04 LAB — NMR, LIPOPROFILE
Cholesterol, Total: 180 mg/dL (ref 100–199)
HDL Particle Number: 37.3 umol/L (ref >=30.5)
HDL-C: 72 mg/dL (ref >39)
LDL Particle Number: 1147 nmol/L — ABNORMAL HIGH (ref <1000)
LDL Size: 20.9 nm (ref >20.5)
LDL-C (NIH Calc): 95 mg/dL (ref 0–99)
LP-IR Score: 26 (ref <=45)
Small LDL Particle Number: 317 nmol/L (ref <=527)
Triglycerides: 70 mg/dL (ref 0–149)

## 2023-02-08 ENCOUNTER — Ambulatory Visit: Payer: Medicare HMO | Attending: Internal Medicine | Admitting: Internal Medicine

## 2023-02-08 ENCOUNTER — Encounter: Payer: Self-pay | Admitting: Internal Medicine

## 2023-02-08 VITALS — BP 132/74 | HR 61 | Ht 64.0 in | Wt 142.2 lb

## 2023-02-08 DIAGNOSIS — E785 Hyperlipidemia, unspecified: Secondary | ICD-10-CM

## 2023-02-08 DIAGNOSIS — R002 Palpitations: Secondary | ICD-10-CM | POA: Diagnosis not present

## 2023-02-08 DIAGNOSIS — I10 Essential (primary) hypertension: Secondary | ICD-10-CM

## 2023-02-08 NOTE — Patient Instructions (Signed)
Medication Instructions:  Your physician recommends that you continue on your current medications as directed. Please refer to the Current Medication list given to you today.  *If you need a refill on your cardiac medications before your next appointment, please call your pharmacy*   Lab Work: Your physician recommends that you return for lab work in: 1 year- Lipid panel  If you have labs (blood work) drawn today and your tests are completely normal, you will receive your results only by: MyChart Message (if you have MyChart) OR A paper copy in the mail If you have any lab test that is abnormal or we need to change your treatment, we will call you to review the results.    Follow-Up: At [facility], you and your health needs are our priority.  As part of our continuing mission to provide you with exceptional heart care, we have created designated Provider Care Teams.  These Care Teams include your primary Cardiologist (physician) and Advanced Practice Providers (APPs -  Physician Assistants and Nurse Practitioners) who all work together to provide you with the care you need, when you need it.  We recommend signing up for the patient portal called "MyChart".  Sign up information is provided on this After Visit Summary.  MyChart is used to connect with patients for Virtual Visits (Telemedicine).  Patients are able to view lab/test results, encounter notes, upcoming appointments, etc.  Non-urgent messages can be sent to your provider as well.   To learn more about what you can do with MyChart, go to [email or web address].    Your next appointment:   12 month(s)  Provider:   Dr. Rennis Golden

## 2023-02-08 NOTE — Progress Notes (Signed)
OFFICE CONSULT NOTE  Chief Complaint:  Follow-up  Primary Care Physician: Deatra James, MD  HPI:  TNIA ANGLADA is a 67 y.o. female who is being seen today for the evaluation of cardiac risk at the request of Deatra James, MD. This is a pleasant 67 yo female patient, whose husband is a retired Social worker and friend of mine.  She was previously followed by Dr. Garnette Scheuermann prior to his retirement.  She is here today to establish cardiac care.  She recently has had some rise in her cholesterol.  She was seen by Lucile Crater, PA-C who advised her to stop her atorvastatin and switch to rosuvastatin 10 mg daily.  After this change she had horrible stomach issues and stopped the rosuvastatin and then went back to atorvastatin.  She says she cannot tolerate higher dose 20 mg atorvastatin but seems to do okay with the 10 mg dose.  Unfortunately her lipids on that dose were still not at target LDL less than 70 as of November her total cholesterol was 177, HDL 71, triglycerides 62 and LDL 94.  She did have a coronary CT angiogram in September 2021 which showed minimal nonobstructive coronary disease and 0 coronary calcium.  In addition she has a history of palpitations with monitor findings of PSVT, PVCs and PACs.  She says those have improved significantly and may have been related to her thyroid.  She does note that recently she has been getting some issues with hypotension, particularly after taking her blood pressure medicine in the morning.  She is on 10 mg of lisinopril but has lost 10 pounds recently and is regularly exercising doing Pilates.  02/08/2023  Mrs. Brandvold returns today for follow-up.  She feels overall well.  She denies any significant palpitations.  Her blood pressure is little higher today but is generally well-controlled.  Cholesterol has trended up somewhat with LDL particle #1147 (up from 972), LDL-C 95, HDL 72 and triglycerides 70.  Small LDL particle number is low at 317.   This is not much change since last year.  I suspect the small increase in her cholesterol may just be due to overproduction in the liver as her diet is not significantly different and she is still very active.  PMHx:  Past Medical History:  Diagnosis Date   Alopecia    Diverticulosis    Elevated LFTs    Essential hypertension    External hemorrhoid    Lipoma    OF LEFT LOWER RIB   Mild CAD    Osteopenia    Premature atrial contractions    PSVT (paroxysmal supraventricular tachycardia)    PVC's (premature ventricular contractions)    Raynaud phenomenon    Sicca syndrome (HCC)    Thyroid nodule    Thyroiditis     No past surgical history on file.  FAMHx:  Family History  Problem Relation Age of Onset   CAD Neg Hx     SOCHx:   reports that she has never smoked. She has never used smokeless tobacco. No history on file for alcohol use and drug use.  ALLERGIES:  Allergies  Allergen Reactions   Hydrochlorothiazide     Other reaction(s): palpitations   Phenylalanine     Other reaction(s): GI Upset (intolerance)   Sulfa Antibiotics     ROS: Pertinent items noted in HPI and remainder of comprehensive ROS otherwise negative.  HOME MEDS: Current Outpatient Medications on File Prior to Visit  Medication Sig Dispense Refill  albuterol (VENTOLIN HFA) 108 (90 Base) MCG/ACT inhaler 2 puffs as needed.     atorvastatin (LIPITOR) 10 MG tablet TAKE 1 TABLET BY MOUTH EVERY DAY 90 tablet 3   levothyroxine (SYNTHROID, LEVOTHROID) 25 MCG tablet Take 25 mcg by mouth daily before breakfast.      lisinopril (ZESTRIL) 5 MG tablet Take 1 tablet (5 mg total) by mouth daily. 90 tablet 3   meloxicam (MOBIC) 7.5 MG tablet Take 1 tablet daily with food for 5 days. Then take as needed. 30 tablet 0   methylPREDNISolone (MEDROL DOSEPAK) 4 MG TBPK tablet Take 4 mg by mouth as needed.     metoprolol tartrate (LOPRESSOR) 25 MG tablet Take 1 tablet (25 mg total) by mouth daily as needed  (palpitations). 30 tablet 6   No current facility-administered medications on file prior to visit.    LABS/IMAGING: No results found for this or any previous visit (from the past 48 hour(s)). No results found.  LIPID PANEL:    Component Value Date/Time   CHOL 177 06/01/2022 0909   TRIG 62 06/01/2022 0909   HDL 71 06/01/2022 0909   CHOLHDL 2.5 06/01/2022 0909   LDLCALC 94 06/01/2022 0909    WEIGHTS: Wt Readings from Last 3 Encounters:  02/08/23 142 lb 3.5 oz (64.5 kg)  08/13/22 140 lb 12.8 oz (63.9 kg)  01/28/22 141 lb (64 kg)    VITALS: BP 132/74 (BP Location: Left Arm, Patient Position: Sitting, Cuff Size: Normal)   Pulse 61   Ht 5\' 4"  (1.626 m)   Wt 142 lb 3.5 oz (64.5 kg)   SpO2 98%   BMI 24.41 kg/m   EXAM: Deferred  EKG: Deferred  ASSESSMENT: Minimal nonobstructive coronary disease by coronary CTA, 0 coronary calcium (03/2020) Mixed dyslipidemia, goal LDL less than 70 Hypertension Hypothyroidism History of palpitations (PSVT, PVCs and PACs)  PLAN: 1.   Overall, Mrs. Wohlers continues to feel quite well.  She had no coronary calcium and minimal nonobstructive coronary disease in 2021 by CTA.  Her cholesterol has risen somewhat and she will continue to monitor her diet and lifestyle.  If it continues to rise we may need to consider adding ezetimibe.  Blood pressure is well-controlled.  Her thyroid is managed by her PCP but was recently assessed and is normal.  She has a history of palpitations but has not really needed her metoprolol.  Plan follow-up with me for regular cardiology office visit including EKG at her next visit in 1 year or sooner as necessary.  Chrystie Nose, MD, [facility], FACP  [place]  [facility]  Medical Director of the Advanced Lipid Disorders &  Cardiovascular Risk Reduction Clinic Diplomate of the American Board of Clinical Lipidology Attending Cardiologist  Direct Dial: [phone]  Fax: [phone]  Website:   [email or web address] 02/08/2023, 9:05 AM

## 2023-02-15 DIAGNOSIS — L219 Seborrheic dermatitis, unspecified: Secondary | ICD-10-CM | POA: Diagnosis not present

## 2023-02-16 NOTE — Addendum Note (Signed)
Addended by: Lindell Spar on: 02/16/2023 09:24 AM   Modules accepted: Orders

## 2023-03-01 DIAGNOSIS — L02811 Cutaneous abscess of head [any part, except face]: Secondary | ICD-10-CM | POA: Diagnosis not present

## 2023-03-01 DIAGNOSIS — J320 Chronic maxillary sinusitis: Secondary | ICD-10-CM | POA: Diagnosis not present

## 2023-03-01 DIAGNOSIS — I10 Essential (primary) hypertension: Secondary | ICD-10-CM | POA: Diagnosis not present

## 2023-03-01 DIAGNOSIS — E785 Hyperlipidemia, unspecified: Secondary | ICD-10-CM | POA: Diagnosis not present

## 2023-03-11 DIAGNOSIS — Z13220 Encounter for screening for lipoid disorders: Secondary | ICD-10-CM | POA: Diagnosis not present

## 2023-03-11 DIAGNOSIS — R739 Hyperglycemia, unspecified: Secondary | ICD-10-CM | POA: Diagnosis not present

## 2023-03-11 DIAGNOSIS — N951 Menopausal and female climacteric states: Secondary | ICD-10-CM | POA: Diagnosis not present

## 2023-03-11 DIAGNOSIS — R7982 Elevated C-reactive protein (CRP): Secondary | ICD-10-CM | POA: Diagnosis not present

## 2023-03-11 DIAGNOSIS — M810 Age-related osteoporosis without current pathological fracture: Secondary | ICD-10-CM | POA: Diagnosis not present

## 2023-03-11 DIAGNOSIS — E7211 Homocystinuria: Secondary | ICD-10-CM | POA: Diagnosis not present

## 2023-03-11 DIAGNOSIS — Z79890 Hormone replacement therapy (postmenopausal): Secondary | ICD-10-CM | POA: Diagnosis not present

## 2023-03-11 DIAGNOSIS — E559 Vitamin D deficiency, unspecified: Secondary | ICD-10-CM | POA: Diagnosis not present

## 2023-03-11 DIAGNOSIS — E039 Hypothyroidism, unspecified: Secondary | ICD-10-CM | POA: Diagnosis not present

## 2023-03-16 DIAGNOSIS — H10413 Chronic giant papillary conjunctivitis, bilateral: Secondary | ICD-10-CM | POA: Diagnosis not present

## 2023-03-24 DIAGNOSIS — L02811 Cutaneous abscess of head [any part, except face]: Secondary | ICD-10-CM | POA: Diagnosis not present

## 2023-03-26 DIAGNOSIS — U099 Post covid-19 condition, unspecified: Secondary | ICD-10-CM | POA: Diagnosis not present

## 2023-03-26 DIAGNOSIS — M859 Disorder of bone density and structure, unspecified: Secondary | ICD-10-CM | POA: Diagnosis not present

## 2023-03-26 DIAGNOSIS — E559 Vitamin D deficiency, unspecified: Secondary | ICD-10-CM | POA: Diagnosis not present

## 2023-03-26 DIAGNOSIS — J160 Chlamydial pneumonia: Secondary | ICD-10-CM | POA: Diagnosis not present

## 2023-03-26 DIAGNOSIS — B960 Mycoplasma pneumoniae [M. pneumoniae] as the cause of diseases classified elsewhere: Secondary | ICD-10-CM | POA: Diagnosis not present

## 2023-03-26 DIAGNOSIS — M311 Thrombotic microangiopathy, unspecified: Secondary | ICD-10-CM | POA: Diagnosis not present

## 2023-03-26 DIAGNOSIS — R7982 Elevated C-reactive protein (CRP): Secondary | ICD-10-CM | POA: Diagnosis not present

## 2023-03-26 DIAGNOSIS — R7402 Elevation of levels of lactic acid dehydrogenase (LDH): Secondary | ICD-10-CM | POA: Diagnosis not present

## 2023-03-26 DIAGNOSIS — R7401 Elevation of levels of liver transaminase levels: Secondary | ICD-10-CM | POA: Diagnosis not present

## 2023-04-21 ENCOUNTER — Other Ambulatory Visit: Payer: Self-pay | Admitting: Internal Medicine

## 2023-04-27 DIAGNOSIS — R69 Illness, unspecified: Secondary | ICD-10-CM | POA: Diagnosis not present

## 2023-05-03 DIAGNOSIS — H10413 Chronic giant papillary conjunctivitis, bilateral: Secondary | ICD-10-CM | POA: Diagnosis not present

## 2023-05-03 DIAGNOSIS — H52203 Unspecified astigmatism, bilateral: Secondary | ICD-10-CM | POA: Diagnosis not present

## 2023-05-14 DIAGNOSIS — I10 Essential (primary) hypertension: Secondary | ICD-10-CM | POA: Diagnosis not present

## 2023-05-14 DIAGNOSIS — E038 Other specified hypothyroidism: Secondary | ICD-10-CM | POA: Diagnosis not present

## 2023-05-14 DIAGNOSIS — E063 Autoimmune thyroiditis: Secondary | ICD-10-CM | POA: Diagnosis not present

## 2023-05-14 DIAGNOSIS — R591 Generalized enlarged lymph nodes: Secondary | ICD-10-CM | POA: Diagnosis not present

## 2023-05-19 DIAGNOSIS — R591 Generalized enlarged lymph nodes: Secondary | ICD-10-CM | POA: Diagnosis not present

## 2023-05-19 DIAGNOSIS — E063 Autoimmune thyroiditis: Secondary | ICD-10-CM | POA: Diagnosis not present

## 2023-05-21 DIAGNOSIS — E063 Autoimmune thyroiditis: Secondary | ICD-10-CM | POA: Diagnosis not present

## 2023-05-21 DIAGNOSIS — R221 Localized swelling, mass and lump, neck: Secondary | ICD-10-CM | POA: Diagnosis not present

## 2023-05-21 DIAGNOSIS — R591 Generalized enlarged lymph nodes: Secondary | ICD-10-CM | POA: Diagnosis not present

## 2023-05-21 DIAGNOSIS — E213 Hyperparathyroidism, unspecified: Secondary | ICD-10-CM | POA: Diagnosis not present

## 2023-05-26 DIAGNOSIS — R591 Generalized enlarged lymph nodes: Secondary | ICD-10-CM | POA: Diagnosis not present

## 2023-05-26 DIAGNOSIS — E063 Autoimmune thyroiditis: Secondary | ICD-10-CM | POA: Diagnosis not present

## 2023-05-26 DIAGNOSIS — Z0100 Encounter for examination of eyes and vision without abnormal findings: Secondary | ICD-10-CM | POA: Diagnosis not present

## 2023-06-14 DIAGNOSIS — E611 Iron deficiency: Secondary | ICD-10-CM | POA: Diagnosis not present

## 2023-06-14 DIAGNOSIS — J160 Chlamydial pneumonia: Secondary | ICD-10-CM | POA: Diagnosis not present

## 2023-06-14 DIAGNOSIS — M311 Thrombotic microangiopathy, unspecified: Secondary | ICD-10-CM | POA: Diagnosis not present

## 2023-06-14 DIAGNOSIS — M3581 Multisystem inflammatory syndrome: Secondary | ICD-10-CM | POA: Diagnosis not present

## 2023-06-14 DIAGNOSIS — U099 Post covid-19 condition, unspecified: Secondary | ICD-10-CM | POA: Diagnosis not present

## 2023-06-14 DIAGNOSIS — I10 Essential (primary) hypertension: Secondary | ICD-10-CM | POA: Diagnosis not present

## 2023-06-14 DIAGNOSIS — R591 Generalized enlarged lymph nodes: Secondary | ICD-10-CM | POA: Diagnosis not present

## 2023-06-14 DIAGNOSIS — M859 Disorder of bone density and structure, unspecified: Secondary | ICD-10-CM | POA: Diagnosis not present

## 2023-06-14 DIAGNOSIS — R7401 Elevation of levels of liver transaminase levels: Secondary | ICD-10-CM | POA: Diagnosis not present

## 2023-06-14 DIAGNOSIS — M8589 Other specified disorders of bone density and structure, multiple sites: Secondary | ICD-10-CM | POA: Diagnosis not present

## 2023-06-14 DIAGNOSIS — E559 Vitamin D deficiency, unspecified: Secondary | ICD-10-CM | POA: Diagnosis not present

## 2023-06-14 DIAGNOSIS — E063 Autoimmune thyroiditis: Secondary | ICD-10-CM | POA: Diagnosis not present

## 2023-06-14 DIAGNOSIS — R7982 Elevated C-reactive protein (CRP): Secondary | ICD-10-CM | POA: Diagnosis not present

## 2023-06-14 DIAGNOSIS — R030 Elevated blood-pressure reading, without diagnosis of hypertension: Secondary | ICD-10-CM | POA: Diagnosis not present

## 2023-06-14 DIAGNOSIS — K219 Gastro-esophageal reflux disease without esophagitis: Secondary | ICD-10-CM | POA: Diagnosis not present

## 2023-06-14 DIAGNOSIS — B960 Mycoplasma pneumoniae [M. pneumoniae] as the cause of diseases classified elsewhere: Secondary | ICD-10-CM | POA: Diagnosis not present

## 2023-06-15 DIAGNOSIS — I10 Essential (primary) hypertension: Secondary | ICD-10-CM | POA: Diagnosis not present

## 2023-07-06 DIAGNOSIS — R590 Localized enlarged lymph nodes: Secondary | ICD-10-CM | POA: Diagnosis not present

## 2023-12-21 ENCOUNTER — Other Ambulatory Visit: Payer: Self-pay

## 2023-12-21 DIAGNOSIS — E785 Hyperlipidemia, unspecified: Secondary | ICD-10-CM

## 2023-12-24 ENCOUNTER — Other Ambulatory Visit: Payer: Self-pay | Admitting: Internal Medicine

## 2024-03-25 ENCOUNTER — Other Ambulatory Visit: Payer: Self-pay | Admitting: Internal Medicine

## 2024-03-27 ENCOUNTER — Ambulatory Visit: Admitting: Internal Medicine

## 2024-04-11 ENCOUNTER — Other Ambulatory Visit: Payer: Self-pay | Admitting: Internal Medicine

## 2024-04-19 ENCOUNTER — Other Ambulatory Visit: Payer: Self-pay | Admitting: Internal Medicine

## 2024-05-23 ENCOUNTER — Ambulatory Visit: Payer: Self-pay | Admitting: Internal Medicine

## 2024-05-23 ENCOUNTER — Encounter: Payer: Self-pay | Admitting: Internal Medicine

## 2024-05-23 ENCOUNTER — Other Ambulatory Visit: Payer: Self-pay | Admitting: Gastroenterology

## 2024-05-23 ENCOUNTER — Ambulatory Visit: Attending: Internal Medicine | Admitting: Internal Medicine

## 2024-05-23 VITALS — BP 130/82 | HR 66 | Ht 64.0 in | Wt 137.2 lb

## 2024-05-23 DIAGNOSIS — E785 Hyperlipidemia, unspecified: Secondary | ICD-10-CM

## 2024-05-23 DIAGNOSIS — I2510 Atherosclerotic heart disease of native coronary artery without angina pectoris: Secondary | ICD-10-CM

## 2024-05-23 DIAGNOSIS — I10 Essential (primary) hypertension: Secondary | ICD-10-CM | POA: Diagnosis not present

## 2024-05-23 DIAGNOSIS — R1011 Right upper quadrant pain: Secondary | ICD-10-CM

## 2024-05-23 DIAGNOSIS — R002 Palpitations: Secondary | ICD-10-CM

## 2024-05-23 LAB — NMR, LIPOPROFILE
Cholesterol, Total: 191 mg/dL (ref 100–199)
HDL Particle Number: 41.7 umol/L (ref >=30.5)
HDL-C: 82 mg/dL (ref >39)
LDL Particle Number: 926 nmol/L (ref <1000)
LDL Size: 20.7 nm (ref >20.5)
LDL-C (NIH Calc): 96 mg/dL (ref 0–99)
LP-IR Score: 33 (ref <=45)
Small LDL Particle Number: 230 nmol/L (ref <=527)
Triglycerides: 73 mg/dL (ref 0–149)

## 2024-05-23 NOTE — Patient Instructions (Signed)
 Medication Instructions:  NO CHANGES  *If you need a refill on your cardiac medications before your next appointment, please call your pharmacy*   Follow-Up: At [facility], you and your health needs are our priority.  As part of our continuing mission to provide you with exceptional heart care, our providers are all part of one team.  This team includes your primary Cardiologist (physician) and Advanced Practice Providers or APPs (Physician Assistants and Nurse Practitioners) who all work together to provide you with the care you need, when you need it.  Your next appointment:    12 months with Dr.Hilty  We recommend signing up for the patient portal called MyChart.  Sign up information is provided on this After Visit Summary.  MyChart is used to connect with patients for Virtual Visits (Telemedicine).  Patients are able to view lab/test results, encounter notes, upcoming appointments, etc.  Non-urgent messages can be sent to your provider as well.   To learn more about what you can do with MyChart, go to [email or web address].   Other Instructions

## 2024-05-23 NOTE — Progress Notes (Signed)
 OFFICE CONSULT NOTE  Chief Complaint:  Follow-up  Primary Care Physician: Deane Camie HERO., MD  HPI:  Annette Flores is a 68 y.o. female who is being seen today for the evaluation of cardiac risk at the request of Sun, Vyvyan, MD. This is a pleasant 68 yo female patient, whose husband is a retired Social Worker and friend of mine.  She was previously followed by Dr. Esmeralda Sharps prior to his retirement.  She is here today to establish cardiac care.  She recently has had some rise in her cholesterol.  She was seen by Lonell Bring, PA-C who advised her to stop her atorvastatin  and switch to rosuvastatin  10 mg daily.  After this change she had horrible stomach issues and stopped the rosuvastatin  and then went back to atorvastatin .  She says she cannot tolerate higher dose 20 mg atorvastatin  but seems to do okay with the 10 mg dose.  Unfortunately her lipids on that dose were still not at target LDL less than 70 as of November her total cholesterol was 177, HDL 71, triglycerides 62 and LDL 94.  She did have a coronary CT angiogram in September 2021 which showed minimal nonobstructive coronary disease and 0 coronary calcium .  In addition she has a history of palpitations with monitor findings of PSVT, PVCs and PACs.  She says those have improved significantly and may have been related to her thyroid .  She does note that recently she has been getting some issues with hypotension, particularly after taking her blood pressure medicine in the morning.  She is on 10 mg of lisinopril  but has lost 10 pounds recently and is regularly exercising doing Pilates.  02/08/2023  Annette Flores returns today for follow-up.  She feels overall well.  She denies any significant palpitations.  Her blood pressure is little higher today but is generally well-controlled.  Cholesterol has trended up somewhat with LDL particle #[id number] (up from 972), LDL-C 95, HDL 72 and triglycerides 70.  Small LDL particle number is low at 317.   This is not much change since last year.  I suspect the small increase in her cholesterol may just be due to overproduction in the liver as her diet is not significantly different and she is still very active.  05/23/2024  Annette Flores returns today for follow-up.  Overall she seems to be doing well.  She had recent repeat lipids which show pretty good improvement in her cholesterol.  Her LDL particle number is down to 926 with an LDL 96, HDL 82 and triglycerides 73.  Her small LDL particle number is very low at 230.  She denies any chest pain or shortness of breath.  She has not had any recent significant palpitations.  Blood pressure is good 130/82.  PMHx:  Past Medical History:  Diagnosis Date   Alopecia    Diverticulosis    Elevated LFTs    Essential hypertension    External hemorrhoid    Lipoma    OF LEFT LOWER RIB   Mild CAD    Osteopenia    Premature atrial contractions    PSVT (paroxysmal supraventricular tachycardia)    PVC's (premature ventricular contractions)    Raynaud phenomenon    Sicca syndrome    Thyroid  nodule    Thyroiditis     No past surgical history on file.  FAMHx:  Family History  Problem Relation Age of Onset   CAD Neg Hx     SOCHx:   reports that she  has never smoked. She has never used smokeless tobacco. No history on file for alcohol use and drug use.  ALLERGIES:  Allergies  Allergen Reactions   Hydrochlorothiazide     Other reaction(s): palpitations   Phenylalanine     Other reaction(s): GI Upset (intolerance)   Sulfa Antibiotics     ROS: Pertinent items noted in HPI and remainder of comprehensive ROS otherwise negative.  HOME MEDS: Current Outpatient Medications on File Prior to Visit  Medication Sig Dispense Refill   atorvastatin  (LIPITOR) 10 MG tablet TAKE 1 TABLET BY MOUTH EVERY DAY 90 tablet 0   levothyroxine (SYNTHROID, LEVOTHROID) 25 MCG tablet Take 25 mcg by mouth daily before breakfast.      lisinopril  (ZESTRIL ) 10 MG  tablet Take 10 mg by mouth daily.     lisinopril  (ZESTRIL ) 5 MG tablet TAKE 1 TABLET (5 MG TOTAL) BY MOUTH DAILY. 90 tablet 3   meloxicam  (MOBIC ) 7.5 MG tablet Take 1 tablet daily with food for 5 days. Then take as needed. 30 tablet 0   metoprolol  tartrate (LOPRESSOR ) 25 MG tablet Take 1 tablet (25 mg total) by mouth daily as needed (palpitations). 30 tablet 6   albuterol (VENTOLIN HFA) 108 (90 Base) MCG/ACT inhaler 2 puffs as needed. (Patient not taking: Reported on 05/23/2024)     methylPREDNISolone (MEDROL DOSEPAK) 4 MG TBPK tablet Take 4 mg by mouth as needed. (Patient not taking: Reported on 05/23/2024)     No current facility-administered medications on file prior to visit.    LABS/IMAGING: Results for orders placed or performed in visit on 12/21/23 (from the past 48 hours)  NMR, lipoprofile     Status: None   Collection Time: 05/22/24  9:04 AM  Result Value Ref Range   LDL Particle Number [id number] <1,000 nmol/L    Comment:                           Low                   < 1000                           Moderate         1000 - 1299                           Borderline-High  1300 - 1599                           High             1600 - 2000                           Very High             > 2000    LDL-C (NIH Calc) 96 0 - 99 mg/dL    Comment:                           Optimal               <  100                           Above optimal  100 -  129                           Borderline        130 -  159                           High              160 -  189                           Very high             >  189    HDL-C 82 >39 mg/dL   Triglycerides 73 0 - 149 mg/dL   Cholesterol, Total 808 100 - 199 mg/dL   HDL Particle Number [id number] >=30.5 umol/L   Small LDL Particle Number 230 <=527 nmol/L   LDL Size 20.7 >20.5 nm    Comment:  ----------------------------------------------------------                  ** INTERPRETATIVE INFORMATION**                  PARTICLE CONCENTRATION AND  SIZE                     <--Lower CVD Risk   Higher CVD Risk-->   LDL AND HDL PARTICLES   Percentile in Reference Population   HDL-P (total)        High     75th    50th    25th   Low                        >34.9    34.9    30.5    26.7   <26.7   Small LDL-P          Low      25th    50th    75th   High                        <117     117     527     839    >839   LDL Size   <-Large (Pattern A)->    <-Small (Pattern B)->                     23.0    20.6           20.5      19.0  ---------------------------------------------------------- Small LDL-P and LDL Size are associated with CVD risk, but not after LDL-P is taken into account.    LP-IR Score 33 <=45    Comment: INSULIN  RESISTANCE MARKER     Insulin  Sensitive    Insulin  Resistant-->            Percentile in Reference Population Insulin  Resistance Score LP-IR Score   Low   25th   50th   75th   High               <27   27     45     63     >63 LP-IR Score is inaccurate if patient is non-fasting. The LP-IR score is a laboratory developed index that has been associated with insulin  resistance and  diabetes risk and should be used as one component of a physician's clinical assessment.    No results found.  LIPID PANEL:    Component Value Date/Time   CHOL 177 06/01/2022 0909   TRIG 62 06/01/2022 0909   HDL 71 06/01/2022 0909   CHOLHDL 2.5 06/01/2022 0909   LDLCALC 94 06/01/2022 0909    WEIGHTS: Wt Readings from Last 3 Encounters:  05/23/24 137 lb 3.2 oz (62.2 kg)  02/08/23 142 lb 3.5 oz (64.5 kg)  08/13/22 140 lb 12.8 oz (63.9 kg)    VITALS: BP 130/82 (BP Location: Left Arm, Patient Position: Sitting, Cuff Size: Normal)   Pulse 66   Ht 5' 4 (1.626 m)   Wt 137 lb 3.2 oz (62.2 kg)   SpO2 94%   BMI 23.55 kg/m   EXAM: General appearance: alert and no distress Lungs: clear to auscultation bilaterally Heart: regular rate and rhythm, S1, S2 normal, no murmur, click, rub or gallop Extremities: extremities normal,  atraumatic, no cyanosis or edema Neurologic: Grossly normal  EKG: EKG Interpretation Date/Time:  Tuesday May 23 2024 16:47:22 EST Ventricular Rate:  66 PR Interval:  160 QRS Duration:  86 QT Interval:  414 QTC Calculation: 434 R Axis:   47  Text Interpretation: Normal sinus rhythm Normal ECG No significant change since last tracing Confirmed by Mona Kent [phone]) on 05/23/2024 4:56:19 PM   ASSESSMENT: Minimal nonobstructive coronary disease by coronary CTA, 0 coronary calcium  (03/2020) Mixed dyslipidemia, goal LDL less than 70 Hypertension Hypothyroidism History of palpitations (PSVT, PVCs and PACs)  PLAN: 1.   Mrs. Meckes seems to be doing well without any chest pain or shortness of breath.  She denies any recurrent palpitations.  Her blood pressure has been well-controlled.  Her lipids are also reasonably well-controlled although LDL is above goal less than 70 her particle numbers are reassuring.  I would recommend staying on the 10 mg atorvastatin  for the time being.  She seems to tolerate this without any side effects.  Follow-up annually or sooner as necessary.  Kent KYM Mona, MD, [facility], FNLA, FACP  [place]  [facility]  Medical Director of the [facility] Diplomate of the American Board of Clinical Lipidology Attending Cardiologist  Direct Dial: [phone]  Fax: [phone]  Website:  [email or web address] Kent BROCKS Arriyah Madej 05/23/2024, 8:29 PM

## 2024-06-01 ENCOUNTER — Ambulatory Visit
Admission: RE | Admit: 2024-06-01 | Discharge: 2024-06-01 | Disposition: A | Discharge status: Home / Self Care | Source: Ambulatory Visit | Attending: Gastroenterology | Admitting: Gastroenterology

## 2024-06-01 DIAGNOSIS — R1011 Right upper quadrant pain: Secondary | ICD-10-CM
